# Patient Record
Sex: Female | Born: 1950
Health system: Southern US, Community
[De-identification: ages and names within clinical notes are randomized; demographics above are authoritative.]

## PROBLEM LIST (undated history)

## (undated) ENCOUNTER — Emergency Department (HOSPITAL_BASED_OUTPATIENT_CLINIC_OR_DEPARTMENT_OTHER): Admission: EM | Source: Home / Self Care

## (undated) DIAGNOSIS — K219 Gastro-esophageal reflux disease without esophagitis: Secondary | ICD-10-CM

## (undated) DIAGNOSIS — I499 Cardiac arrhythmia, unspecified: Secondary | ICD-10-CM

## (undated) DIAGNOSIS — I2510 Atherosclerotic heart disease of native coronary artery without angina pectoris: Secondary | ICD-10-CM

## (undated) DIAGNOSIS — E785 Hyperlipidemia, unspecified: Secondary | ICD-10-CM

## (undated) DIAGNOSIS — S52502A Unspecified fracture of the lower end of left radius, initial encounter for closed fracture: Secondary | ICD-10-CM

## (undated) DIAGNOSIS — I10 Essential (primary) hypertension: Secondary | ICD-10-CM

## (undated) DIAGNOSIS — E039 Hypothyroidism, unspecified: Secondary | ICD-10-CM

## (undated) HISTORY — DX: Hyperlipidemia, unspecified: E78.5

## (undated) HISTORY — DX: Atherosclerotic heart disease of native coronary artery without angina pectoris: I25.10

## (undated) HISTORY — PX: OTHER SURGICAL HISTORY: SHX169

## (undated) HISTORY — DX: Essential (primary) hypertension: I10

## (undated) HISTORY — DX: Hypothyroidism, unspecified: E03.9

---

## 2000-06-03 ENCOUNTER — Ambulatory Visit (HOSPITAL_COMMUNITY): Admission: RE | Admit: 2000-06-03 | Discharge: 2000-06-03 | Payer: Self-pay | Admitting: Obstetrics and Gynecology

## 2016-11-13 DIAGNOSIS — I10 Essential (primary) hypertension: Secondary | ICD-10-CM | POA: Diagnosis not present

## 2016-11-14 DIAGNOSIS — S90121A Contusion of right lesser toe(s) without damage to nail, initial encounter: Secondary | ICD-10-CM | POA: Diagnosis not present

## 2016-11-20 DIAGNOSIS — Z1159 Encounter for screening for other viral diseases: Secondary | ICD-10-CM | POA: Diagnosis not present

## 2016-11-20 DIAGNOSIS — E039 Hypothyroidism, unspecified: Secondary | ICD-10-CM | POA: Diagnosis not present

## 2016-11-20 DIAGNOSIS — Z23 Encounter for immunization: Secondary | ICD-10-CM | POA: Diagnosis not present

## 2016-11-20 DIAGNOSIS — Z0000 Encounter for general adult medical examination without abnormal findings: Secondary | ICD-10-CM | POA: Diagnosis not present

## 2016-11-20 DIAGNOSIS — Z780 Asymptomatic menopausal state: Secondary | ICD-10-CM | POA: Diagnosis not present

## 2016-11-20 DIAGNOSIS — Z1231 Encounter for screening mammogram for malignant neoplasm of breast: Secondary | ICD-10-CM | POA: Diagnosis not present

## 2017-01-22 DIAGNOSIS — E039 Hypothyroidism, unspecified: Secondary | ICD-10-CM | POA: Diagnosis not present

## 2017-01-22 DIAGNOSIS — Z1159 Encounter for screening for other viral diseases: Secondary | ICD-10-CM | POA: Diagnosis not present

## 2017-01-27 DIAGNOSIS — M2012 Hallux valgus (acquired), left foot: Secondary | ICD-10-CM | POA: Diagnosis not present

## 2017-01-27 DIAGNOSIS — M2011 Hallux valgus (acquired), right foot: Secondary | ICD-10-CM | POA: Diagnosis not present

## 2017-01-27 DIAGNOSIS — L600 Ingrowing nail: Secondary | ICD-10-CM | POA: Diagnosis not present

## 2017-04-05 DIAGNOSIS — I2510 Atherosclerotic heart disease of native coronary artery without angina pectoris: Secondary | ICD-10-CM | POA: Diagnosis not present

## 2017-04-05 DIAGNOSIS — E785 Hyperlipidemia, unspecified: Secondary | ICD-10-CM | POA: Diagnosis not present

## 2017-04-05 DIAGNOSIS — I10 Essential (primary) hypertension: Secondary | ICD-10-CM | POA: Diagnosis not present

## 2017-04-05 DIAGNOSIS — R0609 Other forms of dyspnea: Secondary | ICD-10-CM | POA: Diagnosis not present

## 2017-04-08 DIAGNOSIS — I10 Essential (primary) hypertension: Secondary | ICD-10-CM | POA: Diagnosis not present

## 2017-04-08 DIAGNOSIS — Z6828 Body mass index (BMI) 28.0-28.9, adult: Secondary | ICD-10-CM | POA: Diagnosis not present

## 2017-04-08 DIAGNOSIS — E038 Other specified hypothyroidism: Secondary | ICD-10-CM | POA: Diagnosis not present

## 2017-04-08 DIAGNOSIS — E663 Overweight: Secondary | ICD-10-CM | POA: Diagnosis not present

## 2017-04-08 DIAGNOSIS — Z1389 Encounter for screening for other disorder: Secondary | ICD-10-CM | POA: Diagnosis not present

## 2017-04-08 DIAGNOSIS — E784 Other hyperlipidemia: Secondary | ICD-10-CM | POA: Diagnosis not present

## 2017-05-05 DIAGNOSIS — I2510 Atherosclerotic heart disease of native coronary artery without angina pectoris: Secondary | ICD-10-CM | POA: Diagnosis not present

## 2017-05-05 DIAGNOSIS — R0609 Other forms of dyspnea: Secondary | ICD-10-CM | POA: Diagnosis not present

## 2017-05-05 DIAGNOSIS — I10 Essential (primary) hypertension: Secondary | ICD-10-CM | POA: Diagnosis not present

## 2017-05-14 DIAGNOSIS — E785 Hyperlipidemia, unspecified: Secondary | ICD-10-CM | POA: Diagnosis not present

## 2017-05-14 DIAGNOSIS — I10 Essential (primary) hypertension: Secondary | ICD-10-CM | POA: Diagnosis not present

## 2017-05-28 DIAGNOSIS — I10 Essential (primary) hypertension: Secondary | ICD-10-CM | POA: Diagnosis not present

## 2017-05-28 DIAGNOSIS — Z01419 Encounter for gynecological examination (general) (routine) without abnormal findings: Secondary | ICD-10-CM | POA: Diagnosis not present

## 2017-05-28 DIAGNOSIS — E039 Hypothyroidism, unspecified: Secondary | ICD-10-CM | POA: Diagnosis not present

## 2017-05-28 DIAGNOSIS — E785 Hyperlipidemia, unspecified: Secondary | ICD-10-CM | POA: Diagnosis not present

## 2017-05-28 DIAGNOSIS — Z780 Asymptomatic menopausal state: Secondary | ICD-10-CM | POA: Diagnosis not present

## 2017-06-07 DIAGNOSIS — I10 Essential (primary) hypertension: Secondary | ICD-10-CM | POA: Diagnosis not present

## 2017-06-07 DIAGNOSIS — E785 Hyperlipidemia, unspecified: Secondary | ICD-10-CM | POA: Diagnosis not present

## 2017-06-07 DIAGNOSIS — I2510 Atherosclerotic heart disease of native coronary artery without angina pectoris: Secondary | ICD-10-CM | POA: Diagnosis not present

## 2017-07-19 DIAGNOSIS — E785 Hyperlipidemia, unspecified: Secondary | ICD-10-CM | POA: Diagnosis not present

## 2017-07-19 NOTE — Progress Notes (Signed)
Referring-Richard Reynaldo Minium MD Reason for referral-CAD  HPI: 66 yo female for evaluation of CAD at request of Burnard Bunting MD. patient previously resided in [facility]. She presents to establish. She was noted to have coronary calcification on prior CT scan. Echocardiogram in April 2014 showed normal LV function. Last nuclear study July 2018 showed ejection fraction 73%. There was diaphragmatic attenuation but no ischemia. Patient denies dyspnea, chest pain, palpitations or syncope. She has not tolerated statins in the past because of myalgias.  Current Outpatient Prescriptions  Medication Sig Dispense Refill  . carvedilol (COREG) 3.125 MG tablet Take 3.125 mg by mouth 2 (two) times daily.  3  . ezetimibe (ZETIA) 10 MG tablet Take 10 mg by mouth daily.  3  . levothyroxine (SYNTHROID, LEVOTHROID) 100 MCG tablet Take 100 mcg by mouth daily. TAKE 1 TABLET EVERYDAY EXCEPT TAKE 1 AND 1 HALF TABLETS ON WEDNESDAYS.  2   No current facility-administered medications for this visit.     Allergies  Allergen Reactions  . Anaprox [Naproxen] Swelling    Lips swell.     Past Medical History:  Diagnosis Date  . CAD (coronary artery disease)   . Hyperlipidemia   . Hypertension   . Hypothyroid     Past Surgical History:  Procedure Laterality Date  . laproscopic endometrial surgrey      Social History   Social History  . Marital status: Married    Spouse name: N/A  . Number of children: 2  . Years of education: N/A   Occupational History  . Not on file.   Social History Main Topics  . Smoking status: Never Smoker  . Smokeless tobacco: Never Used  . Alcohol use No  . Drug use: Unknown  . Sexual activity: Not on file   Other Topics Concern  . Not on file   Social History Narrative  . No narrative on file    Family History  Problem Relation Age of Onset  . Heart attack Mother   . Heart disease Mother   . Stroke Mother   . Diabetes Father   . Heart failure  Father     ROS: no fevers or chills, productive cough, hemoptysis, dysphasia, odynophagia, melena, hematochezia, dysuria, hematuria, rash, seizure activity, orthopnea, PND, pedal edema, claudication. Remaining systems are negative.  Physical Exam:   Blood pressure (!) 142/100, pulse 68, height 5\' 1"  (1.549 m), weight 154 lb (69.9 kg).  General:  Well developed/well nourished in NAD Skin warm/dry Patient not depressed No peripheral clubbing Back-normal HEENT-normal/normal eyelids Neck supple/normal carotid upstroke bilaterally; no bruits; no JVD; no thyromegaly chest - CTA/ normal expansion CV - RRR/normal S1 and S2; no murmurs, rubs or gallops;  PMI nondisplaced Abdomen -NT/ND, no HSM, no mass, + bowel sounds, no bruit 2+ femoral pulses, no bruits Ext-no edema, chords, 2+ DP Neuro-grossly nonfocal  ECG - 04/05/2017-sinus rhythm with no ST changes. personally reviewed  A/P  1 CAD-based on previous CT scan showing coronary calcification. Patient is not having chest pain. Recent nuclear study by outside report negative with normal LV function. Continue medical therapy. Add enteric-coated aspirin 81 mg daily. She is intolerant to statins.  2 hypertension-blood pressure is elevated. Increase carvedilol to 6.25 mg twice a day as patient also has occasional mild palpitations. Add HCTZ 12.5 mg daily. Check potassium and renal function in one week. Follow blood pressure at home and advance regimen as needed.  3 hyperlipidemia-patient has not tolerated Lipitor, Zocor or Crestor in  the past because of myalgias. She brought her most recent laboratories with her today. These were personally reviewed. Her cholesterol in August 2018 was 273 with LDL 185. I will continue Zetia. I will refer to lipid clinic for consideration of Repatha.  Kirk Ruths, MD

## 2017-07-29 ENCOUNTER — Telehealth: Payer: Self-pay | Admitting: Cardiology

## 2017-07-29 NOTE — Telephone Encounter (Signed)
Received incoming records from [facility] for upcoming appointment on 08/02/17 @ 2pm with Dr. Stanford Breed. Records given to [facility] in Medical Records. 07/29/17 ab

## 2017-08-02 ENCOUNTER — Ambulatory Visit (INDEPENDENT_AMBULATORY_CARE_PROVIDER_SITE_OTHER): Payer: Medicare Other | Admitting: Cardiology

## 2017-08-02 ENCOUNTER — Encounter (INDEPENDENT_AMBULATORY_CARE_PROVIDER_SITE_OTHER): Payer: Self-pay

## 2017-08-02 ENCOUNTER — Encounter: Payer: Self-pay | Admitting: Cardiology

## 2017-08-02 VITALS — BP 142/100 | HR 68 | Ht 61.0 in | Wt 154.0 lb

## 2017-08-02 DIAGNOSIS — I10 Essential (primary) hypertension: Secondary | ICD-10-CM | POA: Diagnosis not present

## 2017-08-02 DIAGNOSIS — E7800 Pure hypercholesterolemia, unspecified: Secondary | ICD-10-CM

## 2017-08-02 DIAGNOSIS — I2510 Atherosclerotic heart disease of native coronary artery without angina pectoris: Secondary | ICD-10-CM | POA: Diagnosis not present

## 2017-08-02 MED ORDER — ASPIRIN EC 81 MG PO TBEC: 81.0000 mg | DELAYED_RELEASE_TABLET | Freq: Every day | ORAL | 3 refills | Status: DC

## 2017-08-02 MED ORDER — HYDROCHLOROTHIAZIDE 12.5 MG PO CAPS: 12.5000 mg | ORAL_CAPSULE | Freq: Every day | ORAL | 3 refills | Status: DC

## 2017-08-02 MED ORDER — CARVEDILOL 6.25 MG PO TABS: 6.2500 mg | ORAL_TABLET | Freq: Two times a day (BID) | ORAL | 3 refills | Status: DC

## 2017-08-02 NOTE — Patient Instructions (Signed)
Medication Instructions:   INCREASE CARVEDILOL TO 6.25 MG TWICE DAILY= 2 OF THE 3.125 MG TABLETS TWICE DAILY  START ASPIRIN 81 MG ONCE DAILY  START HCTZ 12.5 MG ONCE DAILY  Labwork:  Your physician recommends that you return for lab work in: [place]:  Your physician wants you to follow-up in: [place] will receive a reminder letter in the mail two months in advance. If you don't receive a letter, please call our office to schedule the follow-up appointment.   Your physician recommends that you schedule a follow-up appointment TO ESTABLISH WITH LIPID CLINIC TO DISCUSS MEDICATIONS  If you need a refill on your cardiac medications before your next appointment, please call your pharmacy.

## 2017-08-03 ENCOUNTER — Telehealth: Payer: Self-pay | Admitting: Cardiology

## 2017-08-03 NOTE — Telephone Encounter (Signed)
Called patient today as phones were not working on 08-02-17 to scheduled appointment with lipid clinic to discuss medications.  The patient states she has to call back when husband has his schedule to schedule it.

## 2017-08-05 ENCOUNTER — Telehealth: Payer: Self-pay | Admitting: Cardiology

## 2017-08-05 NOTE — Telephone Encounter (Signed)
Called the patient to schedule her appointment with the lipid clinic to discuss her medications.  Her mailbox was full and I could not leave a message.  The patient said she would call back when she left after her appointment.

## 2017-08-23 ENCOUNTER — Ambulatory Visit (INDEPENDENT_AMBULATORY_CARE_PROVIDER_SITE_OTHER): Payer: Medicare Other | Admitting: Pharmacist Clinician (PhC)/ Clinical Pharmacy Specialist

## 2017-08-23 DIAGNOSIS — I2510 Atherosclerotic heart disease of native coronary artery without angina pectoris: Secondary | ICD-10-CM | POA: Diagnosis not present

## 2017-08-23 DIAGNOSIS — E7801 Familial hypercholesterolemia: Secondary | ICD-10-CM | POA: Diagnosis not present

## 2017-08-23 DIAGNOSIS — E7849 Other hyperlipidemia: Secondary | ICD-10-CM

## 2017-08-23 MED ORDER — ROSUVASTATIN CALCIUM 5 MG PO TABS: ORAL_TABLET | ORAL | 3 refills | Status: DC

## 2017-08-23 NOTE — Progress Notes (Signed)
08/24/2017 Casey Kramer [birth year] [id number]   HPI:  Casey Kramer is a 66 y.o. female patient of Dr. Stanford Breed , who presents today for a lipid clinic evaluation.  She is new to our practice, having seen Dr. Stanford Breed for the first time last month.  She has only lived in [place] a short time, having moved here from [place] in July.  She had coronary calcium testing while in [place], and I am waiting for a copy of those results.  She also reports having developed myalgias while on statin drugs, prescribed by her PCP there.    Current Medications:  Ezetimibe 10 mg daily  Family history:  Mother with 2 MI and 3 strokes, plus multiple TIAs; first in her early 104's, now 63 (one of 45, 81 still living)  Father with AVreplacement, DM, PAD with stents (this year), now 6  Mother sister had multiple MI (3 puls stroke) died recently at 71,   1 son with DM, htn  1 daughter with high cholesterol, thyroid    Intolerant/previously tried:  Multiple statins, while living in [place]  Family history:   No tobacco, no alcohol;   Diet:   Now that moved eating out less; no restrictions on diet; veggies prefer fresh or canned;   Exercise:    No regular exercise since moved here, previously walked 2-3 miles per day  Labs:   05/2017:  TC 273, LDL 185 (pulled from Dr. Stanford Breed note)  Will have patient get labs this week   Current Outpatient Medications  Medication Sig Dispense Refill  . aspirin EC 81 MG tablet Take 1 tablet (81 mg total) by mouth daily. 90 tablet 3  . carvedilol (COREG) 6.25 MG tablet Take 1 tablet (6.25 mg total) by mouth 2 (two) times daily. 180 tablet 3  . ezetimibe (ZETIA) 10 MG tablet Take 10 mg by mouth daily.  3  . hydrochlorothiazide (MICROZIDE) 12.5 MG capsule Take 1 capsule (12.5 mg total) by mouth daily. 90 capsule 3  . levothyroxine (SYNTHROID, LEVOTHROID) 100 MCG tablet Take 100 mcg by mouth daily. TAKE 1 TABLET EVERYDAY EXCEPT TAKE 1 AND 1 HALF TABLETS ON  WEDNESDAYS.  2  . rosuvastatin (CRESTOR) 5 MG tablet Take 1 tablet by mouth up to 3 times per week as tolerated 12 tablet 3   No current facility-administered medications for this visit.     Allergies  Allergen Reactions  . Anaprox [Naproxen] Swelling    Lips swell.    Past Medical History:  Diagnosis Date  . CAD (coronary artery disease)   . Hyperlipidemia   . Hypertension   . Hypothyroid     There were no vitals taken for this visit.   Familial hyperlipidemia Patient with last reported LDL at 185 (LDL > 190 defined as primary hyperlipidemia).  Will need to get copies of her coronary calcium testing as well as updated lipid profile.  Once that information is in we can submit a request to her insurance company for approval of [place].      [name] Medal PharmD CPP [facility]

## 2017-08-23 NOTE — Patient Instructions (Signed)
Get labs drawn this week.  We will start the paperwork to get Repatha covered by your insurance.  The process can take anywhere from 1-4 weeks to get covered.    Evolocumab (Repatha) injection What is this medicine? EVOLOCUMAB (e voe LOK ue mab) is known as a PCSK9 inhibitor. It is used to lower the level of cholesterol in the blood. It may be used alone or in combination with other cholesterol-lowering drugs. This drug may also be used to reduce the risk of heart attack, stroke, and certain types of heart surgery in patients with heart disease. This medicine may be used for other purposes; ask your health care provider or pharmacist if you have questions. COMMON BRAND NAME(S): REPATHA What should I tell my health care provider before I take this medicine? They need to know if you have any of these conditions: -an unusual or allergic reaction to evolocumab, other medicines, foods, dyes, or preservatives -pregnant or trying to get pregnant -breast-feeding How should I use this medicine? This medicine is for injection under the skin. You will be taught how to prepare and give this medicine. Use exactly as directed. Take your medicine at regular intervals. Do not take your medicine more often than directed. It is important that you put your used needles and syringes in a special sharps container. Do not put them in a trash can. If you do not have a sharps container, call your pharmacist or health care provider to get one. Talk to your pediatrician regarding the use of this medicine in children. While this drug may be prescribed for children as young as 13 years for selected conditions, precautions do apply. Overdosage: If you think you have taken too much of this medicine contact a poison control center or emergency room at once. NOTE: This medicine is only for you. Do not share this medicine with others. What if I miss a dose? If you miss a dose, take it as soon as you can if there are more than 7  days until the next scheduled dose, or skip the missed dose and take the next dose according to your original schedule. Do not take double or extra doses. What may interact with this medicine? Interactions are not expected. This list may not describe all possible interactions. Give your health care provider a list of all the medicines, herbs, non-prescription drugs, or dietary supplements you use. Also tell them if you smoke, drink alcohol, or use illegal drugs. Some items may interact with your medicine. What should I watch for while using this medicine? You may need blood work while you are taking this medicine. What side effects may I notice from receiving this medicine? Side effects that you should report to your doctor or health care professional as soon as possible: -allergic reactions like skin rash, itching or hives, swelling of the face, lips, or tongue -signs and symptoms of infection like fever or chills; cough; sore throat; pain or trouble passing urine Side effects that usually do not require medical attention (report to your doctor or health care professional if they continue or are bothersome): -diarrhea -nausea -muscle pain -pain, redness, or irritation at site where injected This list may not describe all possible side effects. Call your doctor for medical advice about side effects. You may report side effects to FDA at 1-800-FDA-1088. Where should I keep my medicine? Keep out of the reach of children. You will be instructed on how to store this medicine. Throw away any unused medicine after  the expiration date on the label. NOTE: This sheet is a summary. It may not cover all possible information. If you have questions about this medicine, talk to your doctor, pharmacist, or health care provider.  2018 Elsevier/Gold Standard (2016-09-14 13:21:53)

## 2017-08-24 ENCOUNTER — Encounter: Payer: Self-pay | Admitting: Pharmacist Clinician (PhC)/ Clinical Pharmacy Specialist

## 2017-08-24 DIAGNOSIS — E7849 Other hyperlipidemia: Secondary | ICD-10-CM | POA: Diagnosis not present

## 2017-08-24 DIAGNOSIS — I2510 Atherosclerotic heart disease of native coronary artery without angina pectoris: Secondary | ICD-10-CM | POA: Diagnosis not present

## 2017-08-24 LAB — BASIC METABOLIC PANEL
BUN/Creatinine Ratio: 23 (ref 12–28)
BUN: 20 mg/dL (ref 8–27)
CALCIUM: 9.7 mg/dL (ref 8.7–10.3)
CHLORIDE: 102 mmol/L (ref 96–106)
CO2: 22 mmol/L (ref 20–29)
Creatinine, Ser: 0.86 mg/dL (ref 0.57–1.00)
GFR calc non Af Amer: 71 mL/min/{1.73_m2} (ref >59)
GFR, EST AFRICAN AMERICAN: 81 mL/min/{1.73_m2} (ref >59)
Glucose: 88 mg/dL (ref 65–99)
POTASSIUM: 4.4 mmol/L (ref 3.5–5.2)
SODIUM: 141 mmol/L (ref 134–144)

## 2017-08-24 NOTE — Assessment & Plan Note (Signed)
Patient with last reported LDL at 185 (LDL > 190 defined as primary hyperlipidemia).  Will need to get copies of her coronary calcium testing as well as updated lipid profile.  Once that information is in we can submit a request to her insurance company for approval of [place].

## 2017-08-26 ENCOUNTER — Encounter: Payer: Self-pay | Admitting: Cardiology

## 2017-08-26 NOTE — Telephone Encounter (Signed)
New message ° ° °Patient calling for lab results. Please call °

## 2017-08-26 NOTE — Telephone Encounter (Signed)
This encounter was created in error - please disregard.

## 2017-08-27 LAB — SPECIMEN STATUS REPORT

## 2017-08-27 LAB — LIPID PANEL
CHOL/HDL RATIO: 3.8 ratio (ref 0.0–4.4)
Cholesterol, Total: 234 mg/dL — ABNORMAL HIGH (ref 100–199)
HDL: 62 mg/dL (ref >39)
LDL CALC: 154 mg/dL — AB (ref 0–99)
TRIGLYCERIDES: 89 mg/dL (ref 0–149)
VLDL Cholesterol Cal: 18 mg/dL (ref 5–40)

## 2017-09-29 DIAGNOSIS — Z23 Encounter for immunization: Secondary | ICD-10-CM | POA: Diagnosis not present

## 2017-09-29 DIAGNOSIS — E663 Overweight: Secondary | ICD-10-CM | POA: Diagnosis not present

## 2017-09-29 DIAGNOSIS — E7849 Other hyperlipidemia: Secondary | ICD-10-CM | POA: Diagnosis not present

## 2017-09-29 DIAGNOSIS — E038 Other specified hypothyroidism: Secondary | ICD-10-CM | POA: Diagnosis not present

## 2017-09-29 DIAGNOSIS — I10 Essential (primary) hypertension: Secondary | ICD-10-CM | POA: Diagnosis not present

## 2017-11-22 DIAGNOSIS — E7801 Familial hypercholesterolemia: Secondary | ICD-10-CM | POA: Diagnosis not present

## 2017-11-22 LAB — LIPID PANEL
CHOL/HDL RATIO: 2.9 ratio (ref 0.0–4.4)
Cholesterol, Total: 184 mg/dL (ref 100–199)
HDL: 63 mg/dL (ref >39)
LDL Calculated: 103 mg/dL — ABNORMAL HIGH (ref 0–99)
Triglycerides: 92 mg/dL (ref 0–149)
VLDL CHOLESTEROL CAL: 18 mg/dL (ref 5–40)

## 2017-11-23 NOTE — Progress Notes (Signed)
Please schedule for CVRR clinic appt

## 2017-11-25 ENCOUNTER — Telehealth: Payer: Self-pay | Admitting: Pharmacist Clinician (PhC)/ Clinical Pharmacy Specialist

## 2017-11-30 NOTE — Telephone Encounter (Signed)
Spoke with patient to review labs - LDL now 103 on ezetimibe 10 mg daily and rosuvastatin 5 mg twice weekly.  Patient is primary prevention, LDL at goal now.

## 2017-12-22 ENCOUNTER — Other Ambulatory Visit: Payer: Self-pay | Admitting: Cardiology

## 2017-12-23 ENCOUNTER — Ambulatory Visit (INDEPENDENT_AMBULATORY_CARE_PROVIDER_SITE_OTHER): Payer: Medicare Other | Admitting: Podiatry

## 2017-12-23 ENCOUNTER — Encounter: Payer: Self-pay | Admitting: Podiatry

## 2017-12-23 VITALS — BP 167/95 | HR 66

## 2017-12-23 DIAGNOSIS — L600 Ingrowing nail: Secondary | ICD-10-CM | POA: Diagnosis not present

## 2017-12-23 DIAGNOSIS — B351 Tinea unguium: Secondary | ICD-10-CM | POA: Diagnosis not present

## 2017-12-23 NOTE — Patient Instructions (Signed)
You can get fungi-nail at the drug store  [place] 1/4 cup of epsom salts in a quart of warm tap water.  Submerge your foot or feet with outer bandage intact for the initial soak; this will allow the bandage to become moist and wet for easy lift off.  Once you remove your bandage, continue to soak in the solution for 20 minutes.  This soak should be done twice a day.  Next, remove your foot or feet from solution, blot dry the affected area and cover.  You may use a band aid large enough to cover the area or use gauze and tape.  Apply other medications to the area as directed by the doctor such as polysporin neosporin.  IF YOUR SKIN BECOMES IRRITATED WHILE USING THESE INSTRUCTIONS, IT IS OKAY TO SWITCH TO  WHITE VINEGAR AND WATER. Or you may use antibacterial soap and water to keep the toe clean  Monitor for any signs/symptoms of infection. Call the office immediately if any occur or go directly to the emergency room. Call with any questions/concerns.

## 2017-12-23 NOTE — Telephone Encounter (Signed)
Rx(s) sent to pharmacy electronically.  

## 2017-12-27 NOTE — Progress Notes (Signed)
Subjective:   Patient ID: Casey Kramer, female   DOB: 67 y.o.   MRN: [id number]   HPI Casey Kramer presents the office today for concerns of the left big toenail becoming pink on the nail corner.  She states this is been ongoing for about 1 year.  She previously saw a podiatrist in [place].  She states that there was no treatment at that point and she states is gotten somewhat worse.  Her nails are somewhat thickened discolored.  She denies any drainage or pus coming from the area.  She has no other concerns today.   Review of Systems  All other systems reviewed and are negative.  Past Medical History:  Diagnosis Date  . CAD (coronary artery disease)   . Hyperlipidemia   . Hypertension   . Hypothyroid     Past Surgical History:  Procedure Laterality Date  . laproscopic endometrial surgrey       Current Outpatient Medications:  .  aspirin EC 81 MG tablet, Take 1 tablet (81 mg total) by mouth daily., Disp: 90 tablet, Rfl: 3 .  carvedilol (COREG) 6.25 MG tablet, Take 1 tablet (6.25 mg total) by mouth 2 (two) times daily., Disp: 180 tablet, Rfl: 3 .  ezetimibe (ZETIA) 10 MG tablet, Take 10 mg by mouth daily., Disp: , Rfl: 3 .  levothyroxine (SYNTHROID, LEVOTHROID) 100 MCG tablet, Take 100 mcg by mouth daily. TAKE 1 TABLET EVERYDAY EXCEPT TAKE 1 AND 1 HALF TABLETS ON WEDNESDAYS., Disp: , Rfl: 2 .  rosuvastatin (CRESTOR) 5 MG tablet, TAKE 1 TABLET BY MOUTH UP TO 3 TIMES PER WEEK AS TOLERATED, Disp: 12 tablet, Rfl: 6 .  Calcium-Magnesium-Vitamin D (CALCIUM 500 PO), Takes 3 times daily, Disp: , Rfl:  .  hydrochlorothiazide (MICROZIDE) 12.5 MG capsule, Take 1 capsule (12.5 mg total) by mouth daily., Disp: 90 capsule, Rfl: 3 .  UNABLE TO FIND, aspirin 81 mg effervescent tablet, Disp: , Rfl:  .  UNABLE TO FIND, Vitamin D3 1,000 unit capsule, Disp: , Rfl:   Allergies  Allergen Reactions  . Statins Other (See Comments)  . Amlodipine   . Aspirin   . Naproxen Swelling and Other (See Comments)     Lips swell.  . Other Other (See Comments)    Social History   Socioeconomic History  . Marital status: Married    Spouse name: Not on file  . Number of children: 2  . Years of education: Not on file  . Highest education level: Not on file  Social Needs  . Financial resource strain: Not on file  . Food insecurity - worry: Not on file  . Food insecurity - inability: Not on file  . Transportation needs - medical: Not on file  . Transportation needs - non-medical: Not on file  Occupational History  . Not on file  Tobacco Use  . Smoking status: Never Smoker  . Smokeless tobacco: Never Used  Substance and Sexual Activity  . Alcohol use: No  . Drug use: Not on file  . Sexual activity: Not on file  Other Topics Concern  . Not on file  Social History Narrative  . Not on file         Objective:  Physical Exam  General: AAO x3, NAD  Dermatological: There is incurvation present along the left hallux nail corner there is mild pink discoloration to the nail corner there is tenderness over the nail distally.  There is no drainage or pus there is no ascending cellulitis.  There is no fluctuation or crepitation.  There is no malodor.  No open lesions or pre-ulcerative lesions.  The nail itself is somewhat discolored yellow-brown discoloration and mildly thickened.  Vascular: Dorsalis Pedis artery and Posterior Tibial artery pedal pulses are 2/4 bilateral with immedate capillary fill time.  There is no pain with calf compression, swelling, warmth, erythema.   Neruologic: Grossly intact via light touch bilateral.Protective threshold with Semmes Wienstein monofilament intact to all pedal sites bilateral.   Musculoskeletal: No gross boney pedal deformities bilateral. No pain, crepitus, or limitation noted with foot and ankle range of motion bilateral. Muscular strength 5/5 in all groups tested bilateral.  Gait: Unassisted, Nonantalgic.       Assessment:   Ingrown toenail left  hallux toenail with likely onychomycosis    Plan:  -Treatment options discussed including all alternatives, risks, and complications -Etiology of symptoms were discussed -Discussed partial nail avulsion.  I did sharply debride the toenail so that any complications or bleeding.  If symptoms continue she did have a partial nail avulsion.  Given the thickening discoloration discussed nail fungus treatment.  She is instructed Fungi-Nail OTC.  Trula Slade DPM

## 2018-01-13 DIAGNOSIS — Z6829 Body mass index (BMI) 29.0-29.9, adult: Secondary | ICD-10-CM | POA: Diagnosis not present

## 2018-01-13 DIAGNOSIS — E7849 Other hyperlipidemia: Secondary | ICD-10-CM | POA: Diagnosis not present

## 2018-01-13 DIAGNOSIS — M79605 Pain in left leg: Secondary | ICD-10-CM | POA: Diagnosis not present

## 2018-01-13 DIAGNOSIS — M791 Myalgia, unspecified site: Secondary | ICD-10-CM | POA: Diagnosis not present

## 2018-01-13 DIAGNOSIS — E559 Vitamin D deficiency, unspecified: Secondary | ICD-10-CM | POA: Diagnosis not present

## 2018-01-13 DIAGNOSIS — Z1321 Encounter for screening for nutritional disorder: Secondary | ICD-10-CM | POA: Diagnosis not present

## 2018-01-13 DIAGNOSIS — I10 Essential (primary) hypertension: Secondary | ICD-10-CM | POA: Diagnosis not present

## 2018-01-13 DIAGNOSIS — M545 Low back pain: Secondary | ICD-10-CM | POA: Diagnosis not present

## 2018-01-14 ENCOUNTER — Ambulatory Visit: Payer: Medicare Other | Admitting: Podiatry

## 2018-02-17 DIAGNOSIS — Z6829 Body mass index (BMI) 29.0-29.9, adult: Secondary | ICD-10-CM | POA: Diagnosis not present

## 2018-02-17 DIAGNOSIS — J069 Acute upper respiratory infection, unspecified: Secondary | ICD-10-CM | POA: Diagnosis not present

## 2018-02-17 DIAGNOSIS — I10 Essential (primary) hypertension: Secondary | ICD-10-CM | POA: Diagnosis not present

## 2018-02-17 DIAGNOSIS — R05 Cough: Secondary | ICD-10-CM | POA: Diagnosis not present

## 2018-02-17 DIAGNOSIS — R52 Pain, unspecified: Secondary | ICD-10-CM | POA: Diagnosis not present

## 2018-03-04 ENCOUNTER — Ambulatory Visit: Payer: Medicare Other | Admitting: Cardiology

## 2018-03-29 DIAGNOSIS — R82998 Other abnormal findings in urine: Secondary | ICD-10-CM | POA: Diagnosis not present

## 2018-03-29 DIAGNOSIS — I10 Essential (primary) hypertension: Secondary | ICD-10-CM | POA: Diagnosis not present

## 2018-03-29 DIAGNOSIS — E038 Other specified hypothyroidism: Secondary | ICD-10-CM | POA: Diagnosis not present

## 2018-03-31 DIAGNOSIS — Z1212 Encounter for screening for malignant neoplasm of rectum: Secondary | ICD-10-CM | POA: Diagnosis not present

## 2018-04-04 NOTE — Progress Notes (Signed)
HPI: FU CAD. She was noted to have coronary calcification on prior CT scan. Echocardiogram in April 2014 showed normal LV function. Last nuclear study July 2018 showed ejection fraction 73%. There was diaphragmatic attenuation but no ischemia. Since last seen, the patient denies any dyspnea on exertion, orthopnea, PND, pedal edema, palpitations, syncope or chest pain.   Current Outpatient Medications  Medication Sig Dispense Refill  . aspirin EC 81 MG tablet Take 1 tablet (81 mg total) by mouth daily. 90 tablet 3  . Calcium-Magnesium-Vitamin D (CALCIUM 500 PO) Takes 3 times daily    . carvedilol (COREG) 6.25 MG tablet Take 1 tablet (6.25 mg total) by mouth 2 (two) times daily. 180 tablet 3  . ezetimibe (ZETIA) 10 MG tablet Take 10 mg by mouth daily.  3  . levothyroxine (SYNTHROID, LEVOTHROID) 100 MCG tablet Take 100 mcg by mouth daily. TAKE 1 TABLET EVERYDAY EXCEPT TAKE 1 AND 1 HALF TABLETS ON WEDNESDAYS.  2  . rosuvastatin (CRESTOR) 5 MG tablet TAKE 1 TABLET BY MOUTH UP TO 3 TIMES PER WEEK AS TOLERATED 12 tablet 6  . UNABLE TO FIND aspirin 81 mg effervescent tablet    . UNABLE TO FIND Vitamin D3 1,000 unit capsule    . hydrochlorothiazide (MICROZIDE) 12.5 MG capsule Take 1 capsule (12.5 mg total) by mouth daily. 90 capsule 3   No current facility-administered medications for this visit.      Past Medical History:  Diagnosis Date  . CAD (coronary artery disease)   . Hyperlipidemia   . Hypertension   . Hypothyroid     Past Surgical History:  Procedure Laterality Date  . laproscopic endometrial surgrey      Social History   Socioeconomic History  . Marital status: Married    Spouse name: Not on file  . Number of children: 2  . Years of education: Not on file  . Highest education level: Not on file  Occupational History  . Not on file  Social Needs  . Financial resource strain: Not on file  . Food insecurity:    Worry: Not on file    Inability: Not on file  .  Transportation needs:    Medical: Not on file    Non-medical: Not on file  Tobacco Use  . Smoking status: Never Smoker  . Smokeless tobacco: Never Used  Substance and Sexual Activity  . Alcohol use: No  . Drug use: Not on file  . Sexual activity: Not on file  Lifestyle  . Physical activity:    Days per week: Not on file    Minutes per session: Not on file  . Stress: Not on file  Relationships  . Social connections:    Talks on phone: Not on file    Gets together: Not on file    Attends religious service: Not on file    Active member of club or organization: Not on file    Attends meetings of clubs or organizations: Not on file    Relationship status: Not on file  . Intimate partner violence:    Fear of current or ex partner: Not on file    Emotionally abused: Not on file    Physically abused: Not on file    Forced sexual activity: Not on file  Other Topics Concern  . Not on file  Social History Narrative  . Not on file    Family History  Problem Relation Age of Onset  . Heart attack Mother   .  Heart disease Mother   . Stroke Mother   . Diabetes Father   . Heart failure Father     ROS: no fevers or chills, productive cough, hemoptysis, dysphasia, odynophagia, melena, hematochezia, dysuria, hematuria, rash, seizure activity, orthopnea, PND, pedal edema, claudication. Remaining systems are negative.  Physical Exam: Well-developed well-nourished in no acute distress.  Skin is warm and dry.  HEENT is normal.  Neck is supple.  Chest is clear to auscultation with normal expansion.  Cardiovascular exam is regular rate and rhythm.  Abdominal exam nontender or distended. No masses palpated. Extremities show no edema. neuro grossly intact  ECG- personally reviewed  A/P  1 coronary artery disease-this is based on calcium noted in coronaries on previous CT scan.  Patient is not having chest pain and previous nuclear study showed no ischemia.  Plan medical therapy.   Continue aspirin.  He is intolerant to statins.  2 Hypertension-blood pressure is mildly elevated.  However she states typically controlled at home.  Continue to follow and advance regimen as needed.  3 hyperlipidemia-patient has been intolerant to high-dose statins previously.  We will continue with Zetia 10 mg daily and Crestor 5 mg twice weekly.  She is followed in lipid clinic.  She was turned down for Repatha.  Kirk Ruths, MD

## 2018-04-06 DIAGNOSIS — Z6828 Body mass index (BMI) 28.0-28.9, adult: Secondary | ICD-10-CM | POA: Diagnosis not present

## 2018-04-06 DIAGNOSIS — E663 Overweight: Secondary | ICD-10-CM | POA: Diagnosis not present

## 2018-04-06 DIAGNOSIS — E7849 Other hyperlipidemia: Secondary | ICD-10-CM | POA: Diagnosis not present

## 2018-04-06 DIAGNOSIS — M791 Myalgia, unspecified site: Secondary | ICD-10-CM | POA: Diagnosis not present

## 2018-04-06 DIAGNOSIS — Z1389 Encounter for screening for other disorder: Secondary | ICD-10-CM | POA: Diagnosis not present

## 2018-04-06 DIAGNOSIS — E038 Other specified hypothyroidism: Secondary | ICD-10-CM | POA: Diagnosis not present

## 2018-04-06 DIAGNOSIS — Z0000 Encounter for general adult medical examination without abnormal findings: Secondary | ICD-10-CM | POA: Diagnosis not present

## 2018-04-06 DIAGNOSIS — Z23 Encounter for immunization: Secondary | ICD-10-CM | POA: Diagnosis not present

## 2018-04-06 DIAGNOSIS — I10 Essential (primary) hypertension: Secondary | ICD-10-CM | POA: Diagnosis not present

## 2018-04-15 ENCOUNTER — Ambulatory Visit (INDEPENDENT_AMBULATORY_CARE_PROVIDER_SITE_OTHER): Payer: Medicare Other | Admitting: Cardiology

## 2018-04-15 ENCOUNTER — Encounter: Payer: Self-pay | Admitting: Cardiology

## 2018-04-15 VITALS — BP 138/80 | HR 60 | Temp 80.0°F | Ht 61.0 in | Wt 153.0 lb

## 2018-04-15 DIAGNOSIS — I10 Essential (primary) hypertension: Secondary | ICD-10-CM

## 2018-04-15 DIAGNOSIS — I2510 Atherosclerotic heart disease of native coronary artery without angina pectoris: Secondary | ICD-10-CM

## 2018-04-15 DIAGNOSIS — E7800 Pure hypercholesterolemia, unspecified: Secondary | ICD-10-CM

## 2018-04-15 NOTE — Patient Instructions (Signed)
Your physician wants you to follow-up in: ONE YEAR WITH DR CRENSHAW You will receive a reminder letter in the mail two months in advance. If you don't receive a letter, please call our office to schedule the follow-up appointment.   If you need a refill on your cardiac medications before your next appointment, please call your pharmacy.  

## 2018-06-30 ENCOUNTER — Other Ambulatory Visit: Payer: Self-pay | Admitting: Internal Medicine

## 2018-06-30 DIAGNOSIS — Z1231 Encounter for screening mammogram for malignant neoplasm of breast: Secondary | ICD-10-CM

## 2018-07-31 ENCOUNTER — Other Ambulatory Visit: Payer: Self-pay | Admitting: Cardiology

## 2018-08-03 ENCOUNTER — Encounter: Payer: Self-pay | Admitting: Radiology

## 2018-08-03 ENCOUNTER — Ambulatory Visit
Admission: RE | Admit: 2018-08-03 | Discharge: 2018-08-03 | Disposition: A | Discharge status: Home / Self Care | Payer: Medicare Other | Source: Ambulatory Visit | Attending: Internal Medicine | Admitting: Internal Medicine

## 2018-08-03 DIAGNOSIS — Z1231 Encounter for screening mammogram for malignant neoplasm of breast: Secondary | ICD-10-CM

## 2018-08-25 DIAGNOSIS — Z23 Encounter for immunization: Secondary | ICD-10-CM | POA: Diagnosis not present

## 2018-10-21 DIAGNOSIS — Z1331 Encounter for screening for depression: Secondary | ICD-10-CM | POA: Diagnosis not present

## 2018-10-21 DIAGNOSIS — Z1339 Encounter for screening examination for other mental health and behavioral disorders: Secondary | ICD-10-CM | POA: Diagnosis not present

## 2018-10-21 DIAGNOSIS — Z1389 Encounter for screening for other disorder: Secondary | ICD-10-CM | POA: Diagnosis not present

## 2018-10-21 DIAGNOSIS — M791 Myalgia, unspecified site: Secondary | ICD-10-CM | POA: Diagnosis not present

## 2018-10-21 DIAGNOSIS — I10 Essential (primary) hypertension: Secondary | ICD-10-CM | POA: Diagnosis not present

## 2018-10-21 DIAGNOSIS — E663 Overweight: Secondary | ICD-10-CM | POA: Diagnosis not present

## 2018-10-21 DIAGNOSIS — E038 Other specified hypothyroidism: Secondary | ICD-10-CM | POA: Diagnosis not present

## 2018-10-21 DIAGNOSIS — Z6828 Body mass index (BMI) 28.0-28.9, adult: Secondary | ICD-10-CM | POA: Diagnosis not present

## 2018-10-21 DIAGNOSIS — E7849 Other hyperlipidemia: Secondary | ICD-10-CM | POA: Diagnosis not present

## 2019-04-05 ENCOUNTER — Emergency Department (HOSPITAL_COMMUNITY)
Admission: EM | Admit: 2019-04-05 | Discharge: 2019-04-05 | Disposition: A | Discharge status: Home / Self Care | Payer: Medicare Other | Attending: Emergency Medicine | Admitting: Emergency Medicine

## 2019-04-05 ENCOUNTER — Other Ambulatory Visit: Payer: Self-pay

## 2019-04-05 ENCOUNTER — Encounter (HOSPITAL_COMMUNITY): Payer: Self-pay | Admitting: Emergency Medicine

## 2019-04-05 ENCOUNTER — Emergency Department (HOSPITAL_COMMUNITY): Payer: Medicare Other

## 2019-04-05 ENCOUNTER — Telehealth: Payer: Self-pay | Admitting: Cardiology

## 2019-04-05 DIAGNOSIS — R002 Palpitations: Secondary | ICD-10-CM

## 2019-04-05 DIAGNOSIS — Z7982 Long term (current) use of aspirin: Secondary | ICD-10-CM | POA: Insufficient documentation

## 2019-04-05 DIAGNOSIS — I10 Essential (primary) hypertension: Secondary | ICD-10-CM | POA: Insufficient documentation

## 2019-04-05 DIAGNOSIS — I259 Chronic ischemic heart disease, unspecified: Secondary | ICD-10-CM | POA: Diagnosis not present

## 2019-04-05 DIAGNOSIS — Z79899 Other long term (current) drug therapy: Secondary | ICD-10-CM | POA: Diagnosis not present

## 2019-04-05 DIAGNOSIS — Z0389 Encounter for observation for other suspected diseases and conditions ruled out: Secondary | ICD-10-CM | POA: Diagnosis not present

## 2019-04-05 DIAGNOSIS — E039 Hypothyroidism, unspecified: Secondary | ICD-10-CM | POA: Insufficient documentation

## 2019-04-05 LAB — COMPREHENSIVE METABOLIC PANEL
ALT: 16 U/L (ref 0–44)
AST: 18 U/L (ref 15–41)
Albumin: 4 g/dL (ref 3.5–5.0)
Alkaline Phosphatase: 76 U/L (ref 38–126)
Anion gap: 11 (ref 5–15)
BUN: 20 mg/dL (ref 8–23)
CO2: 26 mmol/L (ref 22–32)
Calcium: 9.5 mg/dL (ref 8.9–10.3)
Chloride: 101 mmol/L (ref 98–111)
Creatinine, Ser: 0.87 mg/dL (ref 0.44–1.00)
GFR calc Af Amer: >60 mL/min (ref >60)
GFR calc non Af Amer: >60 mL/min (ref >60)
Glucose, Bld: 107 mg/dL — ABNORMAL HIGH (ref 70–99)
Potassium: 3.3 mmol/L — ABNORMAL LOW (ref 3.5–5.1)
Sodium: 138 mmol/L (ref 135–145)
Total Bilirubin: 0.6 mg/dL (ref 0.3–1.2)
Total Protein: 7.3 g/dL (ref 6.5–8.1)

## 2019-04-05 LAB — CBC WITH DIFFERENTIAL/PLATELET
Abs Immature Granulocytes: 0.01 10*3/uL (ref 0.00–0.07)
Basophils Absolute: 0.1 10*3/uL (ref 0.0–0.1)
Basophils Relative: 1 %
Eosinophils Absolute: 0.3 10*3/uL (ref 0.0–0.5)
Eosinophils Relative: 3 %
HCT: 38.9 % (ref 36.0–46.0)
Hemoglobin: 13 g/dL (ref 12.0–15.0)
Immature Granulocytes: 0 %
Lymphocytes Relative: 35 %
Lymphs Abs: 2.6 10*3/uL (ref 0.7–4.0)
MCH: 28.8 pg (ref 26.0–34.0)
MCHC: 33.4 g/dL (ref 30.0–36.0)
MCV: 86.1 fL (ref 80.0–100.0)
Monocytes Absolute: 0.7 10*3/uL (ref 0.1–1.0)
Monocytes Relative: 9 %
Neutro Abs: 3.9 10*3/uL (ref 1.7–7.7)
Neutrophils Relative %: 52 %
Platelets: 231 10*3/uL (ref 150–400)
RBC: 4.52 MIL/uL (ref 3.87–5.11)
RDW: 12.9 % (ref 11.5–15.5)
WBC: 7.5 10*3/uL (ref 4.0–10.5)
nRBC: 0 % (ref 0.0–0.2)

## 2019-04-05 LAB — TROPONIN I (HIGH SENSITIVITY)
Troponin I (High Sensitivity): 2 ng/L (ref <18)
Troponin I (High Sensitivity): 4 ng/L (ref <18)

## 2019-04-05 LAB — MAGNESIUM: Magnesium: 2 mg/dL (ref 1.7–2.4)

## 2019-04-05 LAB — TSH: TSH: 0.834 u[IU]/mL (ref 0.350–4.500)

## 2019-04-05 MED ORDER — SODIUM CHLORIDE 0.9 % IV BOLUS
1000.0000 mL | Freq: Once | INTRAVENOUS | Status: AC
  Administered 2019-04-05: 1000 mL via INTRAVENOUS

## 2019-04-05 NOTE — ED Triage Notes (Signed)
Pt states she woke up with her heart beating fast along with shortness of breath.

## 2019-04-05 NOTE — Telephone Encounter (Signed)
  Casey Kramer wants to make Dr Stanford Breed aware that she had to go to the ER last night at [facility]. She would like for him to look over he visit summary and let her know if she needs to be seen sooner than August. She had palpitations that woke her up out of her sleep and she felt like her heart was going to pound out of her chest. She still feels the palpitations today but feels better.

## 2019-04-05 NOTE — Telephone Encounter (Signed)
Will forward to Dr Stanford Breed for review .Adonis Housekeeper

## 2019-04-05 NOTE — Telephone Encounter (Signed)
Spoke with pt, Aware of dr crenshaw's recommendations.  °

## 2019-04-05 NOTE — ED Provider Notes (Signed)
[facility] EMERGENCY DEPARTMENT Provider Note   CSN: [id number] Arrival date & time: 04/05/19  0132    History   Chief Complaint Chief Complaint  Patient presents with  . Palpitations    HPI Casey Kramer is a 68 y.o. female.     Pt presents to the ED today with palpitations.  Pt said they woke her up from sleep.  She feels better now.   The pt said she's had palpitations periodically since covid started, but this was the worst episode.  She has not had f/c.  No sob.  No cp.  No known covid exposures.     Past Medical History:  Diagnosis Date  . CAD (coronary artery disease)   . Hyperlipidemia   . Hypertension   . Hypothyroid     Patient Active Problem List   Diagnosis Date Noted  . Familial hyperlipidemia 08/24/2017    Past Surgical History:  Procedure Laterality Date  . laproscopic endometrial surgrey       OB History   No obstetric history on file.      Home Medications    Prior to Admission medications   Medication Sig Start Date End Date Taking? Authorizing Provider  aspirin EC 81 MG tablet Take 1 tablet (81 mg total) by mouth daily. 08/02/17  Yes Lelon Perla, MD  Calcium-Magnesium-Vitamin D (CALCIUM 500 PO) Takes 3 times daily   Yes [provider]  carvedilol (COREG) 6.25 MG tablet TAKE 1 TABLET BY MOUTH TWICE A DAY 08/02/18  Yes Lelon Perla, MD  ezetimibe (ZETIA) 10 MG tablet Take 10 mg by mouth daily. 07/04/17  Yes [provider]  hydrochlorothiazide (MICROZIDE) 12.5 MG capsule TAKE 1 CAPSULE BY MOUTH EVERY DAY 08/02/18  Yes Crenshaw, Denice Bors, MD  levothyroxine (SYNTHROID, LEVOTHROID) 100 MCG tablet Take 100 mcg by mouth daily. TAKE 1 TABLET EVERYDAY EXCEPT TAKE 1 AND 1 HALF TABLETS ON WEDNESDAYS. 07/07/17  Yes [provider]  rosuvastatin (CRESTOR) 5 MG tablet TAKE 1 TABLET BY MOUTH UP TO 3 TIMES PER WEEK AS TOLERATED 12/23/17  Yes Crenshaw, Denice Bors, MD  UNABLE TO FIND aspirin 81 mg effervescent tablet    Yes [provider]  UNABLE TO FIND Vitamin D3 1,000 unit capsule   Yes [provider]    Family History Family History  Problem Relation Age of Onset  . Heart attack Mother   . Heart disease Mother   . Stroke Mother   . Diabetes Father   . Heart failure Father   . Breast cancer Neg Hx     Social History Social History   Tobacco Use  . Smoking status: Never Smoker  . Smokeless tobacco: Never Used  Substance Use Topics  . Alcohol use: No  . Drug use: Not on file     Allergies   Statins, Amlodipine, Aspirin, Naproxen, and Other   Review of Systems Review of Systems  Cardiovascular: Positive for palpitations.  All other systems reviewed and are negative.    Physical Exam Updated Vital Signs BP (!) 157/75   Pulse 67   Temp 98.1 F (36.7 C) (Oral)   Resp 15   Ht 5\' 1"  (1.549 m)   Wt 70.8 kg   SpO2 96%   BMI 29.48 kg/m   Physical Exam Vitals signs and nursing note reviewed.  Constitutional:      Appearance: Normal appearance.  HENT:     Head: Normocephalic and atraumatic.     Right Ear: External  ear normal.     Left Ear: External ear normal.     Nose: Nose normal.     Mouth/Throat:     Mouth: Mucous membranes are moist.     Pharynx: Oropharynx is clear.  Eyes:     Extraocular Movements: Extraocular movements intact.     Conjunctiva/sclera: Conjunctivae normal.     Pupils: Pupils are equal, round, and reactive to light.  Neck:     Musculoskeletal: Normal range of motion and neck supple.  Cardiovascular:     Rate and Rhythm: Normal rate and regular rhythm.     Pulses: Normal pulses.     Heart sounds: Normal heart sounds.  Pulmonary:     Effort: Pulmonary effort is normal.     Breath sounds: Normal breath sounds.  Abdominal:     General: Abdomen is flat. Bowel sounds are normal.     Palpations: Abdomen is soft.  Musculoskeletal: Normal range of motion.  Skin:    General: Skin is warm.     Capillary Refill: Capillary refill  takes less than 2 seconds.  Neurological:     General: No focal deficit present.     Mental Status: She is alert and oriented to person, place, and time.  Psychiatric:        Mood and Affect: Mood normal.        Behavior: Behavior normal.      ED Treatments / Results  Labs (all labs ordered are listed, but only abnormal results are displayed) Labs Reviewed  COMPREHENSIVE METABOLIC PANEL - Abnormal; Notable for the following components:      Result Value   Potassium 3.3 (*)    Glucose, Bld 107 (*)    All other components within normal limits  CBC WITH DIFFERENTIAL/PLATELET  TROPONIN I (HIGH SENSITIVITY)  TROPONIN I (HIGH SENSITIVITY)  TSH  MAGNESIUM  URINALYSIS, ROUTINE W REFLEX MICROSCOPIC    EKG EKG Interpretation  Date/Time:  Wednesday April 05 2019 01:47:06 EDT Ventricular Rate:  73 PR Interval:    QRS Duration: 92 QT Interval:  396 QTC Calculation: 437 R Axis:   13 Text Interpretation:  Sinus rhythm Confirmed by Isla Pence [phone]) on 04/05/2019 3:02:39 AM   Radiology Dg Chest Portable 1 View  Result Date: 04/05/2019 CLINICAL DATA:  Pelvis patient's EXAM: PORTABLE CHEST 1 VIEW COMPARISON:  None. FINDINGS: The heart size and mediastinal contours are within normal limits. Both lungs are clear. The visualized skeletal structures are unremarkable. IMPRESSION: No active disease. Electronically Signed   By: Constance Holster M.D.   On: 04/05/2019 03:06    Procedures Procedures (including critical care time)  Medications Ordered in ED Medications  sodium chloride 0.9 % bolus 1,000 mL (0 mLs Intravenous Stopped 04/05/19 0419)     Initial Impression / Assessment and Plan / ED Course  I have reviewed the triage vital signs and the nursing notes.  Pertinent labs & imaging results that were available during my care of the patient were reviewed by me and considered in my medical decision making (see chart for details).        Pt observed for several hrs in ED.   NSR the entire time.  Labs wnl.  Pt has an appt with her cardiologist, Dr. Stanford Breed in a few weeks.  She is told to return if worse.  F/u with pcp.  Final Clinical Impressions(s) / ED Diagnoses   Final diagnoses:  Palpitations    ED Discharge Orders    None  Isla Pence, MD 04/05/19 [phone]

## 2019-04-05 NOTE — Telephone Encounter (Signed)
No arrhythmia noted in ER; FU as scheduled .Kirk Ruths

## 2019-04-05 NOTE — ED Notes (Signed)
Pt had went to the restroom before was told to get urine sample. Pt is aware now that we need urine sample.

## 2019-04-12 DIAGNOSIS — E038 Other specified hypothyroidism: Secondary | ICD-10-CM | POA: Diagnosis not present

## 2019-04-12 DIAGNOSIS — I10 Essential (primary) hypertension: Secondary | ICD-10-CM | POA: Diagnosis not present

## 2019-04-17 DIAGNOSIS — R82998 Other abnormal findings in urine: Secondary | ICD-10-CM | POA: Diagnosis not present

## 2019-04-17 DIAGNOSIS — I10 Essential (primary) hypertension: Secondary | ICD-10-CM | POA: Diagnosis not present

## 2019-04-19 DIAGNOSIS — E663 Overweight: Secondary | ICD-10-CM | POA: Diagnosis not present

## 2019-04-19 DIAGNOSIS — M791 Myalgia, unspecified site: Secondary | ICD-10-CM | POA: Diagnosis not present

## 2019-04-19 DIAGNOSIS — Z0000 Encounter for general adult medical examination without abnormal findings: Secondary | ICD-10-CM | POA: Diagnosis not present

## 2019-04-19 DIAGNOSIS — E785 Hyperlipidemia, unspecified: Secondary | ICD-10-CM | POA: Diagnosis not present

## 2019-04-19 DIAGNOSIS — E039 Hypothyroidism, unspecified: Secondary | ICD-10-CM | POA: Diagnosis not present

## 2019-04-19 DIAGNOSIS — R002 Palpitations: Secondary | ICD-10-CM | POA: Diagnosis not present

## 2019-04-19 DIAGNOSIS — I10 Essential (primary) hypertension: Secondary | ICD-10-CM | POA: Diagnosis not present

## 2019-05-12 NOTE — Progress Notes (Signed)
HPI: FU CAD.She was noted to have coronary calcification on prior CT scan. Echocardiogram in April 2014 showed normal LV function. Last nuclear study July 2018 showed ejection fraction 73%. There was diaphragmatic attenuation but no ischemia.   Patient seen in the emergency room June 2020 with palpitations.  TSH, magnesium, hemoglobin and troponins normal.  Potassium 3.3.  Electrocardiogram and telemetry showed sinus rhythm.  Since last seen,  patient denies dyspnea, chest pain or syncope.  She has had intermittent palpitations.  Current Outpatient Medications  Medication Sig Dispense Refill  . aspirin EC 81 MG tablet Take 1 tablet (81 mg total) by mouth daily. 90 tablet 3  . Calcium-Magnesium-Vitamin D (CALCIUM 500 PO) Takes 3 times daily    . carvedilol (COREG) 6.25 MG tablet TAKE 1 TABLET BY MOUTH TWICE A DAY 180 tablet 3  . Cholecalciferol (VITAMIN D3) 50 MCG (2000 UT) capsule Take 2,000 Units by mouth daily.    Marland Kitchen ezetimibe (ZETIA) 10 MG tablet Take 10 mg by mouth daily.    . hydrochlorothiazide (MICROZIDE) 12.5 MG capsule TAKE 1 CAPSULE BY MOUTH EVERY DAY 90 capsule 3  . levothyroxine (SYNTHROID) 88 MCG tablet Take 88 mcg by mouth daily before breakfast.    . rosuvastatin (CRESTOR) 10 MG tablet Take 10 mg by mouth once a week. ON Sunday.    Marland Kitchen UNABLE TO FIND aspirin 81 mg effervescent tablet    . UNABLE TO FIND Vitamin D3 1,000 unit capsule     No current facility-administered medications for this visit.      Past Medical History:  Diagnosis Date  . CAD (coronary artery disease)   . Hyperlipidemia   . Hypertension   . Hypothyroid     Past Surgical History:  Procedure Laterality Date  . laproscopic endometrial surgrey      Social History   Socioeconomic History  . Marital status: Married    Spouse name: Not on file  . Number of children: 2  . Years of education: Not on file  . Highest education level: Not on file  Occupational History  . Not on file  Social  Needs  . Financial resource strain: Not on file  . Food insecurity    Worry: Not on file    Inability: Not on file  . Transportation needs    Medical: Not on file    Non-medical: Not on file  Tobacco Use  . Smoking status: Never Smoker  . Smokeless tobacco: Never Used  Substance and Sexual Activity  . Alcohol use: No  . Drug use: Not on file  . Sexual activity: Not on file  Lifestyle  . Physical activity    Days per week: Not on file    Minutes per session: Not on file  . Stress: Not on file  Relationships  . Social Herbalist on phone: Not on file    Gets together: Not on file    Attends religious service: Not on file    Active member of club or organization: Not on file    Attends meetings of clubs or organizations: Not on file    Relationship status: Not on file  . Intimate partner violence    Fear of current or ex partner: Not on file    Emotionally abused: Not on file    Physically abused: Not on file    Forced sexual activity: Not on file  Other Topics Concern  . Not on file  Social History  Narrative  . Not on file    Family History  Problem Relation Age of Onset  . Heart attack Mother   . Heart disease Mother   . Stroke Mother   . Diabetes Father   . Heart failure Father   . Breast cancer Neg Hx     ROS: no fevers or chills, productive cough, hemoptysis, dysphasia, odynophagia, melena, hematochezia, dysuria, hematuria, rash, seizure activity, orthopnea, PND, pedal edema, claudication. Remaining systems are negative.  Physical Exam: Well-developed well-nourished in no acute distress.  Skin is warm and dry.  HEENT is normal.  Neck is supple.  Chest is clear to auscultation with normal expansion.  Cardiovascular exam is regular rate and rhythm.  Abdominal exam nontender or distended. No masses palpated. Extremities show no edema. neuro grossly intact  ECG-April 05, 2019-sinus rhythm with no ST changes.  Personally reviewed  A/P  1  coronary artery disease-coronary calcification has been noted on previous CT scan.  Continue medical therapy with aspirin and statin.  2 hypertension-patient's blood pressure is elevated; increase carvedilol to 12.5 mg twice daily and follow.  3 hyperlipidemia-patient is intolerant to high-dose statin.  She will continue on low-dose Crestor and Zetia.  4 palpitations-repeat echocardiogram.  Increase carvedilol to 12.5 mg twice daily.  If symptoms persist we will arrange an event monitor.  Kirk Ruths, MD

## 2019-05-17 ENCOUNTER — Encounter: Payer: Self-pay | Admitting: Cardiology

## 2019-05-17 ENCOUNTER — Ambulatory Visit (INDEPENDENT_AMBULATORY_CARE_PROVIDER_SITE_OTHER): Payer: Medicare Other | Admitting: Cardiology

## 2019-05-17 ENCOUNTER — Other Ambulatory Visit: Payer: Self-pay

## 2019-05-17 VITALS — BP 140/98 | HR 69 | Temp 97.3°F | Ht 62.0 in | Wt 156.0 lb

## 2019-05-17 DIAGNOSIS — R002 Palpitations: Secondary | ICD-10-CM | POA: Diagnosis not present

## 2019-05-17 DIAGNOSIS — I10 Essential (primary) hypertension: Secondary | ICD-10-CM

## 2019-05-17 LAB — BASIC METABOLIC PANEL
BUN/Creatinine Ratio: 15 (ref 12–28)
BUN: 13 mg/dL (ref 8–27)
CO2: 24 mmol/L (ref 20–29)
Calcium: 10.1 mg/dL (ref 8.7–10.3)
Chloride: 102 mmol/L (ref 96–106)
Creatinine, Ser: 0.87 mg/dL (ref 0.57–1.00)
GFR calc Af Amer: 80 mL/min/{1.73_m2} (ref >59)
GFR calc non Af Amer: 69 mL/min/{1.73_m2} (ref >59)
Glucose: 86 mg/dL (ref 65–99)
Potassium: 4.5 mmol/L (ref 3.5–5.2)
Sodium: 141 mmol/L (ref 134–144)

## 2019-05-17 MED ORDER — CARVEDILOL 12.5 MG PO TABS: 12.5000 mg | ORAL_TABLET | Freq: Two times a day (BID) | ORAL | 3 refills | Status: DC

## 2019-05-17 NOTE — Patient Instructions (Addendum)
Medication Instructions:  INCREASE CARVEDILOL TO 12.5 MG TWICE DAILY=2 OF THE 6.25 MG TABLETS TWICE DAILY  If you need a refill on your cardiac medications before your next appointment, please call your pharmacy.   Lab work: Your physician recommends that you HAVE LAB WORK TODAY  If you have labs (blood work) drawn today and your tests are completely normal, you will receive your results only by: Marland Kitchen MyChart Message (if you have MyChart) OR . A paper copy in the mail If you have any lab test that is abnormal or we need to change your treatment, we will call you to review the results.   TESTING: Your physician has requested that you have an echocardiogram. Echocardiography is a painless test that uses sound waves to create images of your heart. It provides your doctor with information about the size and shape of your heart and how well your heart's chambers and valves are working. This procedure takes approximately one hour. There are no restrictions for this procedure.[place]    Follow-Up: At [facility], you and your health needs are our priority.  As part of our continuing mission to provide you with exceptional heart care, we have created designated Provider Care Teams.  These Care Teams include your primary Cardiologist (physician) and Advanced Practice Providers (APPs -  Physician Assistants and Nurse Practitioners) who all work together to provide you with the care you need, when you need it. You will need a follow up appointment in 6 months.  Please call our office 2 months in advance to schedule this appointment.  You may see Kirk Ruths MD or one of the following Advanced Practice Providers on your designated Care Team:   Kerin Ransom, PA-C [place], [place] . Sande Rives, PA-C

## 2019-06-07 ENCOUNTER — Ambulatory Visit (HOSPITAL_COMMUNITY): Payer: Medicare Other | Attending: Cardiovascular Disease

## 2019-06-07 ENCOUNTER — Other Ambulatory Visit: Payer: Self-pay

## 2019-06-07 DIAGNOSIS — R002 Palpitations: Secondary | ICD-10-CM | POA: Diagnosis not present

## 2019-06-07 MED ORDER — PERFLUTREN LIPID MICROSPHERE
1.0000 mL | INTRAVENOUS | Status: AC | PRN
  Administered 2019-06-07: 1 mL via INTRAVENOUS

## 2019-07-14 DIAGNOSIS — Z23 Encounter for immunization: Secondary | ICD-10-CM | POA: Diagnosis not present

## 2019-08-03 ENCOUNTER — Other Ambulatory Visit: Payer: Self-pay | Admitting: Cardiology

## 2019-09-13 ENCOUNTER — Other Ambulatory Visit: Payer: Self-pay

## 2019-09-13 DIAGNOSIS — Z20828 Contact with and (suspected) exposure to other viral communicable diseases: Secondary | ICD-10-CM | POA: Diagnosis not present

## 2019-09-13 DIAGNOSIS — Z20822 Contact with and (suspected) exposure to covid-19: Secondary | ICD-10-CM

## 2019-09-16 LAB — NOVEL CORONAVIRUS, NAA: SARS-CoV-2, NAA: NOT DETECTED

## 2019-10-18 ENCOUNTER — Other Ambulatory Visit: Payer: Self-pay | Admitting: Internal Medicine

## 2019-10-18 DIAGNOSIS — Z1231 Encounter for screening mammogram for malignant neoplasm of breast: Secondary | ICD-10-CM

## 2019-11-08 DIAGNOSIS — Z1331 Encounter for screening for depression: Secondary | ICD-10-CM | POA: Diagnosis not present

## 2019-11-08 DIAGNOSIS — E039 Hypothyroidism, unspecified: Secondary | ICD-10-CM | POA: Diagnosis not present

## 2019-11-08 DIAGNOSIS — E785 Hyperlipidemia, unspecified: Secondary | ICD-10-CM | POA: Diagnosis not present

## 2019-11-08 DIAGNOSIS — I10 Essential (primary) hypertension: Secondary | ICD-10-CM | POA: Diagnosis not present

## 2019-11-15 DIAGNOSIS — E7849 Other hyperlipidemia: Secondary | ICD-10-CM | POA: Diagnosis not present

## 2019-11-20 NOTE — Progress Notes (Signed)
HPI: FUCAD.She was noted to have coronary calcification on prior CT scan. Last nuclear study July 2018 showed ejection fraction 73%. There was diaphragmatic attenuation but no ischemia.  Echocardiogram August 2020 showed normal LV function, grade 2 diastolic dysfunction. Since last seen,  patient denies dyspnea, chest pain or syncope.  Occasional brief palpitations.  Current Outpatient Medications  Medication Sig Dispense Refill  . aspirin EC 81 MG tablet Take 1 tablet (81 mg total) by mouth daily. 90 tablet 3  . Calcium-Magnesium-Vitamin D (CALCIUM 500 PO) Takes 3 times daily    . carvedilol (COREG) 12.5 MG tablet Take 1 tablet (12.5 mg total) by mouth 2 (two) times daily. 180 tablet 3  . Cholecalciferol (VITAMIN D3) 50 MCG (2000 UT) capsule Take 2,000 Units by mouth daily.    Marland Kitchen ezetimibe (ZETIA) 10 MG tablet Take 10 mg by mouth daily.    . hydrochlorothiazide (MICROZIDE) 12.5 MG capsule TAKE 1 CAPSULE BY MOUTH EVERY DAY 90 capsule 2  . levothyroxine (SYNTHROID) 88 MCG tablet Take 88 mcg by mouth daily before breakfast.    . rosuvastatin (CRESTOR) 10 MG tablet Take 10 mg by mouth once a week. ON Sunday.    Marland Kitchen UNABLE TO FIND aspirin 81 mg effervescent tablet    . UNABLE TO FIND Vitamin D3 1,000 unit capsule     No current facility-administered medications for this visit.     Past Medical History:  Diagnosis Date  . CAD (coronary artery disease)   . Hyperlipidemia   . Hypertension   . Hypothyroid     Past Surgical History:  Procedure Laterality Date  . laproscopic endometrial surgrey      Social History   Socioeconomic History  . Marital status: Married    Spouse name: Not on file  . Number of children: 2  . Years of education: Not on file  . Highest education level: Not on file  Occupational History  . Not on file  Tobacco Use  . Smoking status: Never Smoker  . Smokeless tobacco: Never Used  Substance and Sexual Activity  . Alcohol use: No  . Drug use: Not  on file  . Sexual activity: Not on file  Other Topics Concern  . Not on file  Social History Narrative  . Not on file   Social Determinants of Health   Financial Resource Strain:   . Difficulty of Paying Living Expenses: Not on file  Food Insecurity:   . Worried About Charity fundraiser in the Last Year: Not on file  . Ran Out of Food in the Last Year: Not on file  Transportation Needs:   . Lack of Transportation (Medical): Not on file  . Lack of Transportation (Non-Medical): Not on file  Physical Activity:   . Days of Exercise per Week: Not on file  . Minutes of Exercise per Session: Not on file  Stress:   . Feeling of Stress : Not on file  Social Connections:   . Frequency of Communication with Friends and Family: Not on file  . Frequency of Social Gatherings with Friends and Family: Not on file  . Attends Religious Services: Not on file  . Active Member of Clubs or Organizations: Not on file  . Attends Archivist Meetings: Not on file  . Marital Status: Not on file  Intimate Partner Violence:   . Fear of Current or Ex-Partner: Not on file  . Emotionally Abused: Not on file  . Physically Abused:  Not on file  . Sexually Abused: Not on file    Family History  Problem Relation Age of Onset  . Heart attack Mother   . Heart disease Mother   . Stroke Mother   . Diabetes Father   . Heart failure Father   . Breast cancer Neg Hx     ROS: no fevers or chills, productive cough, hemoptysis, dysphasia, odynophagia, melena, hematochezia, dysuria, hematuria, rash, seizure activity, orthopnea, PND, pedal edema, claudication. Remaining systems are negative.  Physical Exam: Well-developed well-nourished in no acute distress.  Skin is warm and dry.  HEENT is normal.  Neck is supple.  Chest is clear to auscultation with normal expansion.  Cardiovascular exam is regular rate and rhythm.  Abdominal exam nontender or distended. No masses palpated. Extremities show no  edema. neuro grossly intact  A/P  1 coronary artery disease-noted on previous CT demonstrating coronary calcification-continue aspirin and statin.  2 palpitations-symptoms reasonably well controlled.  Continue carvedilol.  3 hypertension-blood pressure elevated.  We will try to increase carvedilol to 12.5 mg twice daily to see if it is better controlled.  She had some dizziness with this in the past but is tolerating 12 point 5 in the morning and 6.2 5 in the evening at present.  4 hyperlipidemia-patient is taking Crestor 10 mg weekly due to myalgias.  She apparently did not tolerate Lipitor in the past.  She discontinued Zetia because of expense.  She would like to avoid Repatha.  We will try pravastatin 40 mg daily to see if she tolerates.  Check lipids and liver in 12 weeks.  Kirk Ruths, MD

## 2019-11-22 ENCOUNTER — Ambulatory Visit: Payer: Medicare Other

## 2019-11-29 ENCOUNTER — Other Ambulatory Visit: Payer: Self-pay

## 2019-11-29 ENCOUNTER — Ambulatory Visit (INDEPENDENT_AMBULATORY_CARE_PROVIDER_SITE_OTHER): Payer: Medicare Other | Admitting: Cardiology

## 2019-11-29 ENCOUNTER — Encounter: Payer: Self-pay | Admitting: Cardiology

## 2019-11-29 VITALS — BP 161/94 | HR 70 | Ht 61.0 in | Wt 155.0 lb

## 2019-11-29 DIAGNOSIS — I10 Essential (primary) hypertension: Secondary | ICD-10-CM

## 2019-11-29 DIAGNOSIS — E7800 Pure hypercholesterolemia, unspecified: Secondary | ICD-10-CM | POA: Diagnosis not present

## 2019-11-29 DIAGNOSIS — R002 Palpitations: Secondary | ICD-10-CM

## 2019-11-29 DIAGNOSIS — I2510 Atherosclerotic heart disease of native coronary artery without angina pectoris: Secondary | ICD-10-CM | POA: Diagnosis not present

## 2019-11-29 MED ORDER — PRAVASTATIN SODIUM 40 MG PO TABS: 40.0000 mg | ORAL_TABLET | Freq: Every evening | ORAL | 3 refills | Status: DC

## 2019-11-29 NOTE — Patient Instructions (Signed)
Medication Instructions:  INCREASE CARVEDILOL TO 12.5 MG TWICE DAILY  STOP ROSUVASTATIN  START PRAVASTATIN 40 MG ONCE DAILY  *If you need a refill on your cardiac medications before your next appointment, please call your pharmacy*  Lab Work: Your physician recommends that you return for lab work in: [place]  If you have labs (blood work) drawn today and your tests are completely normal, you will receive your results only by: Marland Kitchen MyChart Message (if you have MyChart) OR . A paper copy in the mail If you have any lab test that is abnormal or we need to change your treatment, we will call you to review the results.  Follow-Up: At [facility], you and your health needs are our priority.  As part of our continuing mission to provide you with exceptional heart care, we have created designated Provider Care Teams.  These Care Teams include your primary Cardiologist (physician) and Advanced Practice Providers (APPs -  Physician Assistants and Nurse Practitioners) who all work together to provide you with the care you need, when you need it.  Your next appointment:   6 month(s)  The format for your next appointment:   Either In Person or Virtual  Provider:   You may see Kirk Ruths MD or one of the following Advanced Practice Providers on your designated Care Team:    Kerin Ransom, PA-C  [place], [place]  Coletta Memos, 

## 2019-12-14 ENCOUNTER — Ambulatory Visit: Payer: Medicare Other

## 2019-12-15 ENCOUNTER — Ambulatory Visit: Payer: Medicare Other | Attending: Internal Medicine

## 2019-12-15 DIAGNOSIS — Z23 Encounter for immunization: Secondary | ICD-10-CM | POA: Insufficient documentation

## 2019-12-15 NOTE — Progress Notes (Signed)
   Covid-19 Vaccination Clinic  Name:  Casey Kramer    MRN: [id number] DOB: 03/08/1951  12/15/2019  Casey Kramer was observed post Covid-19 immunization for 15 minutes without incident. She was provided with Vaccine Information Sheet and instruction to access the V-Safe system.   Casey Kramer was instructed to call 911 with any severe reactions post vaccine: Marland Kitchen Difficulty breathing  . Swelling of face and throat  . A fast heartbeat  . A bad rash all over body  . Dizziness and weakness   Immunizations Administered    Name Date Dose VIS Date Route   Moderna COVID-19 Vaccine 12/15/2019  8:27 AM 0.5 mL 09/12/2019 Intramuscular   Manufacturer: Moderna   Lot: [id number]   [id number]

## 2019-12-25 ENCOUNTER — Ambulatory Visit: Payer: Medicare Other

## 2020-01-17 ENCOUNTER — Ambulatory Visit: Payer: Medicare Other | Attending: Internal Medicine

## 2020-01-17 DIAGNOSIS — Z23 Encounter for immunization: Secondary | ICD-10-CM

## 2020-01-17 NOTE — Progress Notes (Signed)
   Covid-19 Vaccination Clinic  Name:  Casey Kramer    MRN: [id number] DOB: 11-Oct-1951  01/17/2020  Casey Kramer was observed post Covid-19 immunization for 15 minutes without incident. She was provided with Vaccine Information Sheet and instruction to access the V-Safe system.   Casey Kramer was instructed to call 911 with any severe reactions post vaccine: Marland Kitchen Difficulty breathing  . Swelling of face and throat  . A fast heartbeat  . A bad rash all over body  . Dizziness and weakness   Immunizations Administered    Name Date Dose VIS Date Route   Moderna COVID-19 Vaccine 01/17/2020  8:03 AM 0.5 mL 09/12/2019 Intramuscular   Manufacturer: Levan Hurst   [id number]   [id number]

## 2020-02-27 ENCOUNTER — Encounter: Payer: Self-pay | Admitting: *Deleted

## 2020-02-28 ENCOUNTER — Other Ambulatory Visit: Payer: Self-pay

## 2020-02-28 ENCOUNTER — Ambulatory Visit
Admission: RE | Admit: 2020-02-28 | Discharge: 2020-02-28 | Disposition: A | Discharge status: Home / Self Care | Payer: Medicare Other | Source: Ambulatory Visit | Attending: Internal Medicine | Admitting: Internal Medicine

## 2020-02-28 DIAGNOSIS — Z1231 Encounter for screening mammogram for malignant neoplasm of breast: Secondary | ICD-10-CM | POA: Diagnosis not present

## 2020-03-12 ENCOUNTER — Encounter: Payer: Self-pay | Admitting: *Deleted

## 2020-03-18 DIAGNOSIS — E7800 Pure hypercholesterolemia, unspecified: Secondary | ICD-10-CM | POA: Diagnosis not present

## 2020-03-19 LAB — LIPID PANEL
Chol/HDL Ratio: 3.7 ratio (ref 0.0–4.4)
Cholesterol, Total: 260 mg/dL — ABNORMAL HIGH (ref 100–199)
HDL: 71 mg/dL (ref >39)
LDL Chol Calc (NIH): 175 mg/dL — ABNORMAL HIGH (ref 0–99)
Triglycerides: 81 mg/dL (ref 0–149)
VLDL Cholesterol Cal: 14 mg/dL (ref 5–40)

## 2020-03-19 LAB — HEPATIC FUNCTION PANEL
ALT: 13 IU/L (ref 0–32)
AST: 18 IU/L (ref 0–40)
Albumin: 4.7 g/dL (ref 3.8–4.8)
Alkaline Phosphatase: 98 IU/L (ref 48–121)
Bilirubin Total: 0.4 mg/dL (ref 0.0–1.2)
Bilirubin, Direct: 0.1 mg/dL (ref 0.00–0.40)
Total Protein: 7.6 g/dL (ref 6.0–8.5)

## 2020-04-10 ENCOUNTER — Ambulatory Visit (INDEPENDENT_AMBULATORY_CARE_PROVIDER_SITE_OTHER): Payer: Medicare Other | Admitting: Pharmacist

## 2020-04-10 ENCOUNTER — Other Ambulatory Visit: Payer: Self-pay

## 2020-04-10 DIAGNOSIS — I2510 Atherosclerotic heart disease of native coronary artery without angina pectoris: Secondary | ICD-10-CM | POA: Diagnosis not present

## 2020-04-10 DIAGNOSIS — E7849 Other hyperlipidemia: Secondary | ICD-10-CM | POA: Diagnosis not present

## 2020-04-10 MED ORDER — EZETIMIBE 10 MG PO TABS: 10.0000 mg | ORAL_TABLET | Freq: Every day | ORAL | 3 refills | Status: DC

## 2020-04-10 MED ORDER — ROSUVASTATIN CALCIUM 5 MG PO TABS: 5.0000 mg | ORAL_TABLET | ORAL | 3 refills | Status: DC

## 2020-04-10 NOTE — Patient Instructions (Addendum)
It was a pleasure to meet you!  Please start taking rosuvastatin (crestor) 5mg  three times a week Start taking zetia 10mg  daily (call me if there is a cost issue)  Call me if there is any concerns [phone]  We will repeat lipid panel at appointment with Dr. Stanford Breed

## 2020-04-10 NOTE — Progress Notes (Signed)
Patient ID: Casey Kramer                 DOB: 1951/03/26                    MRN: [id number]     HPI: Casey Kramer is a 69 y.o. female patient referred to lipid clinic by [facility] PMH is significant for CAD, HTN, HLD, hypothyroidism. Was previously on rosuvastatin 5mg  twice a week and zetia 10mg . Has been hesistant about PCSK9i. Was seen by our lipid clinic at [place] office about 3 years ago.  Patient presents today to discuss lipid medications. She switched from rosuvastatin 5mg  weekly to pravastatin 40mg  daily in Feb. LDL increased. She tolerated both rosuvastatin weekly and pravastatin well. Tolerated zetia fine as well. Still not interested in PCSK9i. Use to walk a lot with her husband but got out of the routine a while ago. 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 Patient reports that she does not eat a lot, is good about portions.  Current Medications: pravastatin 40mg  daily Intolerances: zetia (cost), atorvastatin (myalgias), simvastatin Risk Factors: CAD found of CT, HTN LDL goal: <70 (due to coronary calcification found on CT)  Diet: breakfast: coffee w/ creamer (teaspoon), cheerios Lunch: nabbs, yogurt Dinner: pasta, pizza (portion controlled) Soda every once and awhile  Exercise: no organized exercise  Family History: Mother with 2 MI and 3 strokes, plus multiple TIAs; first in her early 49's, now 63 (one of 40, 36 still living)             Father with AVreplacement, DM, PAD with stents (this year), now 32             Mother sister had multiple MI (3 puls stroke) died recently at 2,              1 son with DM, htn             1 daughter with high cholesterol, thyroid  Social History:   Labs: 03/18/20 TC 260, TG 81, HDL 71, LDL 175 (pravastatin 40mg  daily)  Past Medical History:  Diagnosis Date  . CAD (coronary artery disease)   . Hyperlipidemia   . Hypertension   . Hypothyroid     Current Outpatient Medications on File Prior to Visit    Medication Sig Dispense Refill  . aspirin EC 81 MG tablet Take 1 tablet (81 mg total) by mouth daily. 90 tablet 3  . Calcium-Magnesium-Vitamin D (CALCIUM 500 PO) Takes 3 times daily    . carvedilol (COREG) 12.5 MG tablet Take 1 tablet (12.5 mg total) by mouth 2 (two) times daily. 180 tablet 3  . Cholecalciferol (VITAMIN D3) 50 MCG (2000 UT) capsule Take 2,000 Units by mouth daily.    Marland Kitchen ezetimibe (ZETIA) 10 MG tablet Take 10 mg by mouth daily.    . hydrochlorothiazide (MICROZIDE) 12.5 MG capsule TAKE 1 CAPSULE BY MOUTH EVERY DAY 90 capsule 2  . levothyroxine (SYNTHROID) 88 MCG tablet Take 88 mcg by mouth daily before breakfast.    . pravastatin (PRAVACHOL) 40 MG tablet Take 1 tablet (40 mg total) by mouth every evening. 90 tablet 3  . UNABLE TO FIND aspirin 81 mg effervescent tablet    . UNABLE TO FIND Vitamin D3 1,000 unit capsule     No current facility-administered medications on file prior to visit.    Allergies  Allergen Reactions  . Statins Other (See Comments)  .  Amlodipine   . Aspirin   . Naproxen Swelling and Other (See Comments)    Lips swell.  . Other Other (See Comments)    Assessment/Plan:  1. Hyperlipidemia - LDL is above goal of <70 (due to CAD seen on CT). Patient had better LDL reduction with rosuvastatin. Since she tolerated once a week well, I have asked the patient to increase rosuvastatin to 5mg  three days a week. Stop pravastatin. Will add zetia 10mg  daily for the best chance at reaching LDL goal. I have also asked the patient to add a walking program into her routine. Recheck lipids at visit with Dr. Stanford Breed on 9/2. If LDL is still above goal, will try increasing rosuvastatin to daily.   Thank you,  Ramond Dial, Pharm.D, BCPS, CPP [place]  [address] [place], [place] [address]  Phone: [phone]; Fax: [phone]

## 2020-04-28 ENCOUNTER — Other Ambulatory Visit: Payer: Self-pay

## 2020-04-28 ENCOUNTER — Encounter (HOSPITAL_COMMUNITY): Payer: Self-pay | Admitting: *Deleted

## 2020-04-28 ENCOUNTER — Emergency Department (HOSPITAL_COMMUNITY)
Admission: EM | Admit: 2020-04-28 | Discharge: 2020-04-28 | Disposition: A | Discharge status: Home / Self Care | Payer: Medicare Other | Attending: Emergency Medicine | Admitting: Emergency Medicine

## 2020-04-28 DIAGNOSIS — I2510 Atherosclerotic heart disease of native coronary artery without angina pectoris: Secondary | ICD-10-CM | POA: Diagnosis not present

## 2020-04-28 DIAGNOSIS — Z79899 Other long term (current) drug therapy: Secondary | ICD-10-CM | POA: Insufficient documentation

## 2020-04-28 DIAGNOSIS — I10 Essential (primary) hypertension: Secondary | ICD-10-CM | POA: Insufficient documentation

## 2020-04-28 DIAGNOSIS — E039 Hypothyroidism, unspecified: Secondary | ICD-10-CM | POA: Diagnosis not present

## 2020-04-28 DIAGNOSIS — R55 Syncope and collapse: Secondary | ICD-10-CM | POA: Insufficient documentation

## 2020-04-28 DIAGNOSIS — R5381 Other malaise: Secondary | ICD-10-CM

## 2020-04-28 LAB — CBC
HCT: 42.7 % (ref 36.0–46.0)
Hemoglobin: 14 g/dL (ref 12.0–15.0)
MCH: 28.9 pg (ref 26.0–34.0)
MCHC: 32.8 g/dL (ref 30.0–36.0)
MCV: 88.2 fL (ref 80.0–100.0)
Platelets: 296 10*3/uL (ref 150–400)
RBC: 4.84 MIL/uL (ref 3.87–5.11)
RDW: 13.6 % (ref 11.5–15.5)
WBC: 12.5 10*3/uL — ABNORMAL HIGH (ref 4.0–10.5)
nRBC: 0 % (ref 0.0–0.2)

## 2020-04-28 LAB — BASIC METABOLIC PANEL
Anion gap: 9 (ref 5–15)
BUN: 34 mg/dL — ABNORMAL HIGH (ref 8–23)
CO2: 28 mmol/L (ref 22–32)
Calcium: 9.3 mg/dL (ref 8.9–10.3)
Chloride: 98 mmol/L (ref 98–111)
Creatinine, Ser: 0.88 mg/dL (ref 0.44–1.00)
GFR calc Af Amer: >60 mL/min (ref >60)
GFR calc non Af Amer: >60 mL/min (ref >60)
Glucose, Bld: 108 mg/dL — ABNORMAL HIGH (ref 70–99)
Potassium: 3.8 mmol/L (ref 3.5–5.1)
Sodium: 135 mmol/L (ref 135–145)

## 2020-04-28 LAB — URINALYSIS, ROUTINE W REFLEX MICROSCOPIC
Bacteria, UA: NONE SEEN
Bilirubin Urine: NEGATIVE
Glucose, UA: NEGATIVE mg/dL
Ketones, ur: NEGATIVE mg/dL
Leukocytes,Ua: NEGATIVE
Nitrite: NEGATIVE
Protein, ur: NEGATIVE mg/dL
Specific Gravity, Urine: 1.009 (ref 1.005–1.030)
pH: 6 (ref 5.0–8.0)

## 2020-04-28 MED ORDER — SODIUM CHLORIDE 0.9% FLUSH: 3.0000 mL | Freq: Once | INTRAVENOUS | Status: DC

## 2020-04-28 NOTE — ED Provider Notes (Signed)
[facility] Provider Note   CSN: [id number] Arrival date & time: 04/28/20  1427     History Chief Complaint  Patient presents with  . Near Syncope    Casey Kramer is a 69 y.o. female.  HPI She presents for evaluation of a tired feeling since awakening this morning.  Later in the day she checked her blood pressure and it was low, 80/40.  She has been eating well.  She completed a treatment course of prednisone for sciatica, yesterday and states that her sciatica is improved.  She denies difficulty eating during treatment with the prednisone.  She denies fever, chills, nausea, vomiting, focal weakness paresthesia.  She has been urinating a lot, and acknowledges that she currently takes hydrochlorothiazide.  She thinks that this is for peripheral edema.  No known sick contacts.  There are no other known modifying factors.    Past Medical History:  Diagnosis Date  . CAD (coronary artery disease)   . Hyperlipidemia   . Hypertension   . Hypothyroid     Patient Active Problem List   Diagnosis Date Noted  . Familial hyperlipidemia 08/24/2017    Past Surgical History:  Procedure Laterality Date  . laproscopic endometrial surgrey       OB History   No obstetric history on file.     Family History  Problem Relation Age of Onset  . Heart attack Mother   . Heart disease Mother   . Stroke Mother   . Diabetes Father   . Heart failure Father   . Breast cancer Neg Hx     Social History   Tobacco Use  . Smoking status: Never Smoker  . Smokeless tobacco: Never Used  Substance Use Topics  . Alcohol use: No  . Drug use: Not on file    Home Medications Prior to Admission medications   Medication Sig Start Date End Date Taking? Authorizing Provider  aspirin EC 81 MG tablet Take 1 tablet (81 mg total) by mouth daily. 08/02/17  Yes Lelon Perla, MD  carvedilol (COREG) 12.5 MG tablet Take 1 tablet (12.5 mg total) by mouth 2 (two) times daily.  05/17/19  Yes Lelon Perla, MD  cholecalciferol (VITAMIN D3) 25 MCG (1000 UNIT) tablet Take 1,000 Units by mouth daily.   Yes [provider]  ezetimibe (ZETIA) 10 MG tablet Take 1 tablet (10 mg total) by mouth daily. 04/10/20 07/09/20 Yes Lelon Perla, MD  hydrochlorothiazide (MICROZIDE) 12.5 MG capsule TAKE 1 CAPSULE BY MOUTH EVERY DAY Patient taking differently: Take 12.5 mg by mouth daily.  08/03/19  Yes Lelon Perla, MD  levothyroxine (SYNTHROID) 88 MCG tablet Take 88 mcg by mouth daily before breakfast.   Yes [provider]  rosuvastatin (CRESTOR) 5 MG tablet Take 1 tablet (5 mg total) by mouth 3 (three) times a week. 04/10/20  Yes Lelon Perla, MD  predniSONE (STERAPRED UNI-PAK 21 TAB) 10 MG (21) TBPK tablet Take by mouth. Patient not taking: Reported on 04/28/2020 04/22/20   [provider]    Allergies    Statins, Amlodipine, Aspirin, and Naproxen  Review of Systems   Review of Systems  All other systems reviewed and are negative.   Physical Exam Updated Vital Signs BP (!) 188/102   Pulse (!) 59   Temp 97.9 F (36.6 C) (Oral)   Resp 16   Ht 5\' 1"  (1.549 m)   Wt 71.2 kg   SpO2 98%   BMI 29.66  kg/m   Physical Exam Vitals and nursing note reviewed.  Constitutional:      General: She is not in acute distress.    Appearance: She is well-developed. She is obese. She is not ill-appearing or diaphoretic.  HENT:     Head: Normocephalic and atraumatic.     Right Ear: External ear normal.     Left Ear: External ear normal.  Eyes:     Conjunctiva/sclera: Conjunctivae normal.     Pupils: Pupils are equal, round, and reactive to light.  Neck:     Trachea: Phonation normal.  Cardiovascular:     Rate and Rhythm: Normal rate and regular rhythm.     Heart sounds: Normal heart sounds.  Pulmonary:     Effort: Pulmonary effort is normal.     Breath sounds: Normal breath sounds.  Abdominal:     General: There is no distension.      Palpations: Abdomen is soft.     Tenderness: There is no abdominal tenderness.  Musculoskeletal:        General: Normal range of motion.     Cervical back: Normal range of motion and neck supple.     Right lower leg: Edema present.     Left lower leg: Edema present.     Comments: 1+ lower leg edema, bilaterally.  Skin:    General: Skin is warm and dry.  Neurological:     Mental Status: She is alert and oriented to person, place, and time.     Cranial Nerves: No cranial nerve deficit.     Sensory: No sensory deficit.     Motor: No abnormal muscle tone.     Coordination: Coordination normal.     Comments: No dysarthria or aphasia.  No nystagmus.  Normal strength, arms and legs bilaterally.  Psychiatric:        Mood and Affect: Mood normal.        Behavior: Behavior normal.        Thought Content: Thought content normal.        Judgment: Judgment normal.     ED Results / Procedures / Treatments   Labs (all labs ordered are listed, but only abnormal results are displayed) Labs Reviewed  BASIC METABOLIC PANEL - Abnormal; Notable for the following components:      Result Value   Glucose, Bld 108 (*)    BUN 34 (*)    All other components within normal limits  CBC - Abnormal; Notable for the following components:   WBC 12.5 (*)    All other components within normal limits  URINALYSIS, ROUTINE W REFLEX MICROSCOPIC - Abnormal; Notable for the following components:   Color, Urine STRAW (*)    Hgb urine dipstick MODERATE (*)    All other components within normal limits    EKG EKG Interpretation  Date/Time:  Sunday April 28 2020 18:32:11 EDT Ventricular Rate:  58 PR Interval:    QRS Duration: 86 QT Interval:  418 QTC Calculation: 411 R Axis:   13 Text Interpretation: Sinus rhythm Baseline wander in lead(s) V1 V6 since last tracing no significant change Confirmed by Daleen Bo [phone]) on 04/28/2020 6:35:52 PM   Radiology No results found.  Procedures Procedures  (including critical care time)  Medications Ordered in ED Medications  sodium chloride flush (NS) 0.9 % injection 3 mL (3 mLs Intravenous Not Given 04/28/20 1900)    ED Course  I have reviewed the triage vital signs and the nursing notes.  Pertinent labs &  imaging results that were available during my care of the patient were reviewed by me and considered in my medical decision making (see chart for details).  Clinical Course as of Apr 28 2006  Sun Apr 28, 2020  2004 Normal except glucose high, BUN high  Basic metabolic panel(!) [EW]  5374 Normal except white count high  CBC(!) [EW]  2005 Normal except presence of hemoglobin  Urinalysis, Routine w reflex microscopic(!) [EW]    Clinical Course User Index [EW] Daleen Bo, MD   MDM Rules/Calculators/A&P                           Patient Vitals for the past 24 hrs:  BP Temp Temp src Pulse Resp SpO2 Height Weight  04/28/20 1859 (!) 188/102 -- -- (!) 59 16 98 % -- --  04/28/20 1851 (!) 180/93 -- -- (!) 58 12 98 % -- --  04/28/20 1459 -- -- -- -- -- -- 5\' 1"  (1.549 m) 71.2 kg  04/28/20 1458 (!) 163/84 97.9 F (36.6 C) Oral 63 20 99 % -- --    8:55 PM Reevaluation with update and discussion. After initial assessment and treatment, an updated evaluation reveals she has been able ambulate easily without dizziness or weakness.  Findings discussed with the patient and all questions were answered. Daleen Bo   Medical Decision Making:  This patient is presenting for evaluation of low blood pressure and tiredness, which does require a range of treatment options, and is a complaint that involves a moderate risk of morbidity and mortality. The differential diagnoses include volume dilution, metabolic instability, serious infection or neurologic disorder. I decided to review old records, and in summary elderly female with history of hypertension, and elevated cholesterol.  I did not require additional historical information from  anyone.  Clinical Laboratory Tests Ordered, included CBC, Metabolic panel and Urinalysis. Review indicates these tests are normal.   Cardiac Monitor Tracing which shows normal sinus rhythm    Critical Interventions-clinical evaluation, laboratory testing, EKG, observation reassessment  After These Interventions, the Patient was reevaluated and was found stable for discharge.  Ambulates normally.  Blood pressures been elevated here with negative orthostatics.  Screening for serious problems have been negative.  Doubt ACS, PE, pneumonia, serious bacterial infection or metabolic instability.  Doubt CVA.  CRITICAL CARE-no Performed by: Daleen Bo  Nursing Notes Reviewed/ Care Coordinated Applicable Imaging Reviewed Interpretation of Laboratory Data incorporated into ED treatment  The patient appears reasonably screened and/or stabilized for discharge and I doubt any other medical condition or other [facility] requiring further screening, evaluation, or treatment in the ED at this time prior to discharge.  Plan: Home Medications-continue usual; Home Treatments-low-salt diet; return here if the recommended treatment, does not improve the symptoms; Recommended follow up-PCP checkup for blood pressure monitoring in 3 days.     Final Clinical Impression(s) / ED Diagnoses Final diagnoses:  None    Rx / DC Orders ED Discharge Orders    None       Daleen Bo, MD 04/28/20 2103

## 2020-04-28 NOTE — ED Notes (Signed)
Pt ambulated around the nurses station without dizziness.

## 2020-04-28 NOTE — Discharge Instructions (Addendum)
The testing today did not show any problems.  Your blood pressure was actually elevated today not low, like it was at home.  It is more likely that your pressure is high needs to be followed by your primary care doctor, than a low blood pressure causing fatigue.  Make sure you are getting plenty of rest and eating 3 meals each day.  Try to stay on a low-salt diet.  Have your doctor check your blood pressure in 3 or 4 days.  Take all of your medicines, as directed.

## 2020-04-28 NOTE — ED Notes (Signed)
Pt ambulated to the bathroom with no assistance.

## 2020-04-28 NOTE — ED Triage Notes (Signed)
Patient presents to the ED with a syncopal episode today at home.  Patient took her BP to which it was 83/43.   Patient reports just finishing prednisone for sciatica.

## 2020-05-02 DIAGNOSIS — E038 Other specified hypothyroidism: Secondary | ICD-10-CM | POA: Diagnosis not present

## 2020-05-02 DIAGNOSIS — E7849 Other hyperlipidemia: Secondary | ICD-10-CM | POA: Diagnosis not present

## 2020-05-08 DIAGNOSIS — E038 Other specified hypothyroidism: Secondary | ICD-10-CM | POA: Diagnosis not present

## 2020-05-08 DIAGNOSIS — M5416 Radiculopathy, lumbar region: Secondary | ICD-10-CM | POA: Diagnosis not present

## 2020-05-08 DIAGNOSIS — Z0000 Encounter for general adult medical examination without abnormal findings: Secondary | ICD-10-CM | POA: Diagnosis not present

## 2020-05-08 DIAGNOSIS — R82998 Other abnormal findings in urine: Secondary | ICD-10-CM | POA: Diagnosis not present

## 2020-05-08 DIAGNOSIS — I10 Essential (primary) hypertension: Secondary | ICD-10-CM | POA: Diagnosis not present

## 2020-05-08 DIAGNOSIS — E7849 Other hyperlipidemia: Secondary | ICD-10-CM | POA: Diagnosis not present

## 2020-05-15 ENCOUNTER — Other Ambulatory Visit: Payer: Self-pay | Admitting: Cardiology

## 2020-05-15 DIAGNOSIS — I10 Essential (primary) hypertension: Secondary | ICD-10-CM

## 2020-05-16 DIAGNOSIS — Z1212 Encounter for screening for malignant neoplasm of rectum: Secondary | ICD-10-CM | POA: Diagnosis not present

## 2020-05-22 DIAGNOSIS — R030 Elevated blood-pressure reading, without diagnosis of hypertension: Secondary | ICD-10-CM | POA: Diagnosis not present

## 2020-05-24 DIAGNOSIS — R030 Elevated blood-pressure reading, without diagnosis of hypertension: Secondary | ICD-10-CM | POA: Diagnosis not present

## 2020-06-10 NOTE — Progress Notes (Signed)
HPI: Casey Kramer.She was noted to have coronary calcification on prior CT scan. Last nuclear study July 2018 showed ejection fraction 73%. There was diaphragmatic attenuation but no ischemia.  Echocardiogram August 2020 showed normal LV function, grade 2 diastolic dysfunction.  Seen in the emergency room July 2021 with malaise and possible low BP.  Patient was not orthostatic by report but was hypertensive.  BUN 34 and creatinine 0.88.  Hemoglobin 14.  Since last seen,  Current Outpatient Medications  Medication Sig Dispense Refill  . aspirin EC 81 MG tablet Take 1 tablet (81 mg total) by mouth daily. 90 tablet 3  . carvedilol (COREG) 12.5 MG tablet TAKE 1 TABLET (12.5 MG TOTAL) BY MOUTH 2 (TWO) TIMES DAILY. 180 tablet 3  . cholecalciferol (VITAMIN D3) 25 MCG (1000 UNIT) tablet Take 1,000 Units by mouth daily.    Marland Kitchen ezetimibe (ZETIA) 10 MG tablet Take 1 tablet (10 mg total) by mouth daily. 90 tablet 3  . hydrochlorothiazide (MICROZIDE) 12.5 MG capsule TAKE 1 CAPSULE BY MOUTH EVERY DAY (Patient taking differently: Take 12.5 mg by mouth daily. ) 90 capsule 2  . levothyroxine (SYNTHROID) 88 MCG tablet Take 88 mcg by mouth daily before breakfast.    . rosuvastatin (CRESTOR) 5 MG tablet Take 1 tablet (5 mg total) by mouth 3 (three) times a week. 40 tablet 3   No current facility-administered medications for this visit.     Past Medical History:  Diagnosis Date  . CAD (coronary artery disease)   . Hyperlipidemia   . Hypertension   . Hypothyroid     Past Surgical History:  Procedure Laterality Date  . laproscopic endometrial surgrey      Social History   Socioeconomic History  . Marital status: Married    Spouse name: Not on file  . Number of children: 2  . Years of education: Not on file  . Highest education level: Not on file  Occupational History  . Not on file  Tobacco Use  . Smoking status: Never Smoker  . Smokeless tobacco: Never Used  Substance and Sexual Activity  .  Alcohol use: No  . Drug use: Not on file  . Sexual activity: Not on file  Other Topics Concern  . Not on file  Social History Narrative  . Not on file   Social Determinants of Health   Financial Resource Strain:   . Difficulty of Paying Living Expenses: Not on file  Food Insecurity:   . Worried About Charity fundraiser in the Last Year: Not on file  . Ran Out of Food in the Last Year: Not on file  Transportation Needs:   . Lack of Transportation (Medical): Not on file  . Lack of Transportation (Non-Medical): Not on file  Physical Activity:   . Days of Exercise per Week: Not on file  . Minutes of Exercise per Session: Not on file  Stress:   . Feeling of Stress : Not on file  Social Connections:   . Frequency of Communication with Friends and Family: Not on file  . Frequency of Social Gatherings with Friends and Family: Not on file  . Attends Religious Services: Not on file  . Active Member of Clubs or Organizations: Not on file  . Attends Archivist Meetings: Not on file  . Marital Status: Not on file  Intimate Partner Violence:   . Fear of Current or Ex-Partner: Not on file  . Emotionally Abused: Not on file  .  Physically Abused: Not on file  . Sexually Abused: Not on file    Family History  Problem Relation Age of Onset  . Heart attack Mother   . Heart disease Mother   . Stroke Mother   . Diabetes Father   . Heart failure Father   . Breast cancer Neg Hx     ROS: no fevers or chills, productive cough, hemoptysis, dysphasia, odynophagia, melena, hematochezia, dysuria, hematuria, rash, seizure activity, orthopnea, PND, pedal edema, claudication. Remaining systems are negative.  Physical Exam: Well-developed well-nourished in no acute distress.  Skin is warm and dry.  HEENT is normal.  Neck is supple.  Chest is clear to auscultation with normal expansion.  Cardiovascular exam is regular rate and rhythm.  Abdominal exam nontender or distended. No masses  palpated. Extremities show no edema. neuro grossly intact  ECG-April 28, 2020-sinus rhythm with nonspecific ST changes.  Personally reviewed  A/P  1 coronary artery disease-patient denies chest pain.  Diagnosis is based on CT demonstrating coronary calcification.  Continue medical therapy with aspirin and statin.  2 hypertension-patient's blood pressure is elevated; however she follows this at home and typically controlled.  Continue present medications and follow.  3 hyperlipidemia-recent LDL June 2021 175.  4 palpitations-plan to continue beta-blocker at present dose.   Kirk Ruths, MD

## 2020-06-13 ENCOUNTER — Ambulatory Visit (INDEPENDENT_AMBULATORY_CARE_PROVIDER_SITE_OTHER): Payer: Medicare Other | Admitting: Cardiology

## 2020-06-13 ENCOUNTER — Encounter: Payer: Self-pay | Admitting: Cardiology

## 2020-06-13 ENCOUNTER — Other Ambulatory Visit: Payer: Self-pay

## 2020-06-13 VITALS — BP 138/82 | HR 75 | Ht 61.0 in | Wt 159.6 lb

## 2020-06-13 DIAGNOSIS — E7800 Pure hypercholesterolemia, unspecified: Secondary | ICD-10-CM | POA: Diagnosis not present

## 2020-06-13 DIAGNOSIS — I2510 Atherosclerotic heart disease of native coronary artery without angina pectoris: Secondary | ICD-10-CM | POA: Diagnosis not present

## 2020-06-13 DIAGNOSIS — I10 Essential (primary) hypertension: Secondary | ICD-10-CM

## 2020-06-13 NOTE — Patient Instructions (Signed)
Medication Instructions:  NO CHANGE *If you need a refill on your cardiac medications before your next appointment, please call your pharmacy*   Follow-Up: At [facility], you and your health needs are our priority.  As part of our continuing mission to provide you with exceptional heart care, we have created designated Provider Care Teams.  These Care Teams include your primary Cardiologist (physician) and Advanced Practice Providers (APPs -  Physician Assistants and Nurse Practitioners) who all work together to provide you with the care you need, when you need it.  We recommend signing up for the patient portal called "MyChart".  Sign up information is provided on this After Visit Summary.  MyChart is used to connect with patients for Virtual Visits (Telemedicine).  Patients are able to view lab/test results, encounter notes, upcoming appointments, etc.  Non-urgent messages can be sent to your provider as well.   To learn more about what you can do with MyChart, go to [email or web address].    Your next appointment:   12 month(s)  The format for your next appointment:   In Person  Provider:   You may see Kirk Ruths MD or one of the following Advanced Practice Providers on your designated Care Team:    Kerin Ransom, PA-C  [place], [place]  Coletta Memos, [place]

## 2020-06-24 ENCOUNTER — Telehealth: Payer: Self-pay

## 2020-06-24 DIAGNOSIS — E7849 Other hyperlipidemia: Secondary | ICD-10-CM

## 2020-06-24 NOTE — Telephone Encounter (Signed)
Called and lmomed the pt to come and complete labs fasting and to call us if they have any questions

## 2020-06-24 NOTE — Telephone Encounter (Signed)
-----   Message from Ramond Dial, [place] sent at 06/24/2020  7:23 AM EDT ----- Can you see if pt is taking her cholesterol medication and if she is-schedule her for labs thanks ----- Message ----- From: Ramond Dial, RPH-CPP Sent: 06/14/2020 To: Ramond Dial, RPH-CPP  lipids

## 2020-07-01 ENCOUNTER — Other Ambulatory Visit: Payer: Self-pay

## 2020-07-01 DIAGNOSIS — E7849 Other hyperlipidemia: Secondary | ICD-10-CM | POA: Diagnosis not present

## 2020-07-01 LAB — HEPATIC FUNCTION PANEL
ALT: 11 IU/L (ref 0–32)
AST: 18 IU/L (ref 0–40)
Albumin: 4.6 g/dL (ref 3.8–4.8)
Alkaline Phosphatase: 80 IU/L (ref 44–121)
Bilirubin Total: 0.5 mg/dL (ref 0.0–1.2)
Bilirubin, Direct: 0.14 mg/dL (ref 0.00–0.40)
Total Protein: 6.9 g/dL (ref 6.0–8.5)

## 2020-07-01 LAB — LIPID PANEL
Chol/HDL Ratio: 2.9 ratio (ref 0.0–4.4)
Cholesterol, Total: 179 mg/dL (ref 100–199)
HDL: 61 mg/dL (ref >39)
LDL Chol Calc (NIH): 103 mg/dL — ABNORMAL HIGH (ref 0–99)
Triglycerides: 84 mg/dL (ref 0–149)
VLDL Cholesterol Cal: 15 mg/dL (ref 5–40)

## 2020-07-03 ENCOUNTER — Telehealth: Payer: Self-pay | Admitting: *Deleted

## 2020-07-03 NOTE — Telephone Encounter (Signed)
Spoke with pt, she reports she is currently taking 5 mg M, W and F. She reports taking the 5 mg tablets daily she was not able to tolerate any increase in that medication. She was not able to try the injectable medications because her insurance and co pay was too high. Will forward for dr Stanford Breed review

## 2020-07-03 NOTE — Telephone Encounter (Signed)
Spoke with pt, she is already taking zetia and will continue her current medications.

## 2020-07-03 NOTE — Telephone Encounter (Signed)
Continue present dose; if she is agreeable, add zetia 10 mg daily with lipids and liver 12 weeks Kirk Ruths

## 2020-07-03 NOTE — Telephone Encounter (Signed)
-----   Message from Lelon Perla, MD sent at 07/01/2020  4:00 PM EDT ----- Increase Crestor to 20 mg daily.  Check lipids and liver in 12 weeks. Kirk Ruths

## 2020-07-26 ENCOUNTER — Other Ambulatory Visit: Payer: Self-pay

## 2020-07-26 ENCOUNTER — Emergency Department (HOSPITAL_COMMUNITY)
Admission: EM | Admit: 2020-07-26 | Discharge: 2020-07-26 | Disposition: A | Discharge status: Home / Self Care | Payer: Medicare Other | Attending: Emergency Medicine | Admitting: Emergency Medicine

## 2020-07-26 ENCOUNTER — Emergency Department (HOSPITAL_COMMUNITY): Payer: Medicare Other

## 2020-07-26 ENCOUNTER — Encounter (HOSPITAL_COMMUNITY): Payer: Self-pay | Admitting: Emergency Medicine

## 2020-07-26 DIAGNOSIS — Z79899 Other long term (current) drug therapy: Secondary | ICD-10-CM | POA: Diagnosis not present

## 2020-07-26 DIAGNOSIS — R519 Headache, unspecified: Secondary | ICD-10-CM | POA: Diagnosis not present

## 2020-07-26 DIAGNOSIS — I2510 Atherosclerotic heart disease of native coronary artery without angina pectoris: Secondary | ICD-10-CM | POA: Insufficient documentation

## 2020-07-26 DIAGNOSIS — E039 Hypothyroidism, unspecified: Secondary | ICD-10-CM | POA: Diagnosis not present

## 2020-07-26 DIAGNOSIS — R112 Nausea with vomiting, unspecified: Secondary | ICD-10-CM | POA: Insufficient documentation

## 2020-07-26 DIAGNOSIS — H8113 Benign paroxysmal vertigo, bilateral: Secondary | ICD-10-CM | POA: Insufficient documentation

## 2020-07-26 DIAGNOSIS — Z7982 Long term (current) use of aspirin: Secondary | ICD-10-CM | POA: Diagnosis not present

## 2020-07-26 DIAGNOSIS — I10 Essential (primary) hypertension: Secondary | ICD-10-CM | POA: Diagnosis not present

## 2020-07-26 DIAGNOSIS — R42 Dizziness and giddiness: Secondary | ICD-10-CM | POA: Diagnosis not present

## 2020-07-26 DIAGNOSIS — H8110 Benign paroxysmal vertigo, unspecified ear: Secondary | ICD-10-CM | POA: Diagnosis not present

## 2020-07-26 LAB — URINALYSIS, ROUTINE W REFLEX MICROSCOPIC
Bacteria, UA: NONE SEEN
Bilirubin Urine: NEGATIVE
Glucose, UA: NEGATIVE mg/dL
Ketones, ur: NEGATIVE mg/dL
Leukocytes,Ua: NEGATIVE
Nitrite: NEGATIVE
Protein, ur: NEGATIVE mg/dL
Specific Gravity, Urine: 1.011 (ref 1.005–1.030)
pH: 7 (ref 5.0–8.0)

## 2020-07-26 LAB — COMPREHENSIVE METABOLIC PANEL
ALT: 18 U/L (ref 0–44)
AST: 19 U/L (ref 15–41)
Albumin: 4.4 g/dL (ref 3.5–5.0)
Alkaline Phosphatase: 71 U/L (ref 38–126)
Anion gap: 6 (ref 5–15)
BUN: 22 mg/dL (ref 8–23)
CO2: 25 mmol/L (ref 22–32)
Calcium: 9.8 mg/dL (ref 8.9–10.3)
Chloride: 102 mmol/L (ref 98–111)
Creatinine, Ser: 0.84 mg/dL (ref 0.44–1.00)
GFR, Estimated: >60 mL/min (ref >60)
Glucose, Bld: 112 mg/dL — ABNORMAL HIGH (ref 70–99)
Potassium: 3.1 mmol/L — ABNORMAL LOW (ref 3.5–5.1)
Sodium: 133 mmol/L — ABNORMAL LOW (ref 135–145)
Total Bilirubin: 0.8 mg/dL (ref 0.3–1.2)
Total Protein: 8 g/dL (ref 6.5–8.1)

## 2020-07-26 LAB — CBC WITH DIFFERENTIAL/PLATELET
Abs Immature Granulocytes: 0.02 10*3/uL (ref 0.00–0.07)
Basophils Absolute: 0 10*3/uL (ref 0.0–0.1)
Basophils Relative: 1 %
Eosinophils Absolute: 0.1 10*3/uL (ref 0.0–0.5)
Eosinophils Relative: 1 %
HCT: 40.5 % (ref 36.0–46.0)
Hemoglobin: 13.5 g/dL (ref 12.0–15.0)
Immature Granulocytes: 0 %
Lymphocytes Relative: 14 %
Lymphs Abs: 0.9 10*3/uL (ref 0.7–4.0)
MCH: 29 pg (ref 26.0–34.0)
MCHC: 33.3 g/dL (ref 30.0–36.0)
MCV: 87.1 fL (ref 80.0–100.0)
Monocytes Absolute: 0.3 10*3/uL (ref 0.1–1.0)
Monocytes Relative: 4 %
Neutro Abs: 5 10*3/uL (ref 1.7–7.7)
Neutrophils Relative %: 80 %
Platelets: 207 10*3/uL (ref 150–400)
RBC: 4.65 MIL/uL (ref 3.87–5.11)
RDW: 13 % (ref 11.5–15.5)
WBC: 6.3 10*3/uL (ref 4.0–10.5)
nRBC: 0 % (ref 0.0–0.2)

## 2020-07-26 LAB — TROPONIN I (HIGH SENSITIVITY)
Troponin I (High Sensitivity): 3 ng/L (ref <18)
Troponin I (High Sensitivity): 3 ng/L (ref <18)

## 2020-07-26 LAB — MAGNESIUM: Magnesium: 2 mg/dL (ref 1.7–2.4)

## 2020-07-26 MED ORDER — POTASSIUM CHLORIDE 10 MEQ/100ML IV SOLN
10.0000 meq | Freq: Once | INTRAVENOUS | Status: AC
  Administered 2020-07-26: 10 meq via INTRAVENOUS
  Filled 2020-07-26: qty 100

## 2020-07-26 MED ORDER — POTASSIUM CHLORIDE CRYS ER 20 MEQ PO TBCR
40.0000 meq | EXTENDED_RELEASE_TABLET | Freq: Once | ORAL | Status: DC
  Filled 2020-07-26: qty 2

## 2020-07-26 MED ORDER — SODIUM CHLORIDE 0.9 % IV BOLUS
500.0000 mL | Freq: Once | INTRAVENOUS | Status: AC
  Administered 2020-07-26: 500 mL via INTRAVENOUS

## 2020-07-26 MED ORDER — MECLIZINE HCL 12.5 MG PO TABS
25.0000 mg | ORAL_TABLET | Freq: Once | ORAL | Status: AC
  Administered 2020-07-26: 25 mg via ORAL
  Filled 2020-07-26: qty 2

## 2020-07-26 MED ORDER — MECLIZINE HCL 25 MG PO TABS: 25.0000 mg | ORAL_TABLET | Freq: Four times a day (QID) | ORAL | 0 refills | Status: AC | PRN

## 2020-07-26 NOTE — ED Notes (Signed)
ED Provider at bedside. 

## 2020-07-26 NOTE — ED Triage Notes (Signed)
Pt reports dizziness upon waking this am. Pt reports went to bed last night around midnight and reports felt tired but denies any symptoms last night. Pt reports emesis this am x3. Pt reports intermittent headache denies any numbness tingling in extremities. Facial symmetry intact, speech clear. Gait steady. Airway patent. Pt denies any known injury.

## 2020-07-26 NOTE — Discharge Instructions (Addendum)
Please follow-up with your primary care provider regarding today's encounter and for ongoing evaluation and management.  You likely have benign paroxysmal positional vertigo.  You would benefit from continued meclizine medications as well as performing Epley maneuver.  Please YouTube "Epley Maneuver" or read the instructions provided.  You may also benefit from referral to ENT and/or vestibular rehab if your symptoms fail to improve with conservative therapy.  Your potassium was also low.  I suspect that this may be related to your HCTZ as well as your emesis x3.  Please eat foods rich in potassium such as potatoes and bananas.  You will need to have laboratory recheck to ensure correction of your electrolyte derangement when you follow-up with your primary care provider.  You also have a 3.4 x 2.7 x 2.5 cm parafalcine meningioma.  This does not seem to be contributing to your symptoms today.  However, would like for you to follow-up with your primary care provider regarding this incidental finding.  Return to the ED or seek immediate medical attention should you experience any new or worsening symptoms.

## 2020-07-26 NOTE — ED Provider Notes (Signed)
[facility] EMERGENCY DEPARTMENT Provider Note   CSN: [id number] Arrival date & time: 07/26/20  1001     History Chief Complaint  Patient presents with  . Dizziness    Casey Kramer is a 69 y.o. female with PMH significant for CAD, HTN, and familial hyperlipidemia who presents the ED with complaints of dizziness and emesis x3 shortly upon waking this morning.  LKN last evening.  She felt mildly fatigued, but no other symptoms when she went to bed.    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  She then states that she slept well.  However, after standing up out of bed, she developed room spinning dizziness.  She went into the bathroom to shower and she states that anytime she turns her head a certain way or looked in any particular direction, it would elicit worsening dizziness symptoms.  She states that she is ambulatory without any ataxia.  She even drove herself to the store.  However, her room spinning dizziness is caused her to feel nauseated she has had 3 episodes of nonbloody emesis.  She reports that she has never wants thrown up since she was a small child.  She has never had this happen before.  Patient also is endorsing a headache.  She denies any blurred vision, changes in hearing or tinnitus, chest pain or shortness of breath, abdominal pain, head injury or precipitating trauma, numbness or weakness, new medications, or other symptoms.  HPI     Past Medical History:  Diagnosis Date  . CAD (coronary artery disease)   . Hyperlipidemia   . Hypertension   . Hypothyroid     Patient Active Problem List   Diagnosis Date Noted  . Familial hyperlipidemia 08/24/2017    Past Surgical History:  Procedure Laterality Date  . laproscopic endometrial surgrey       OB History   No obstetric history on file.     Family History  Problem Relation Age of Onset  . Heart  attack Mother   . Heart disease Mother   . Stroke Mother   . Diabetes Father   . Heart failure Father   . Breast cancer Neg Hx     Social History   Tobacco Use  . Smoking status: Never Smoker  . Smokeless tobacco: Never Used  Substance Use Topics  . Alcohol use: No  . Drug use: Not on file    Home Medications Prior to Admission medications   Medication Sig Start Date End Date Taking? Authorizing Provider  aspirin EC 81 MG tablet Take 1 tablet (81 mg total) by mouth daily. 08/02/17   Lelon Perla, MD  carvedilol (COREG) 12.5 MG tablet TAKE 1 TABLET (12.5 MG TOTAL) BY MOUTH 2 (TWO) TIMES DAILY. 05/15/20   Lelon Perla, MD  cholecalciferol (VITAMIN D3) 25 MCG (1000 UNIT) tablet Take 1,000 Units by mouth daily.    [provider]  ezetimibe (ZETIA) 10 MG tablet Take 1 tablet (10 mg total) by mouth daily. 04/10/20 07/09/20  Lelon Perla, MD  hydrochlorothiazide (MICROZIDE) 12.5 MG capsule TAKE 1 CAPSULE BY MOUTH EVERY DAY Patient taking differently: Take 12.5 mg by mouth daily.  08/03/19   Lelon Perla, MD  levothyroxine (SYNTHROID) 88 MCG tablet Take 88 mcg by mouth daily before breakfast.    [provider]  meclizine (ANTIVERT) 25 MG tablet Take 1 tablet (25  mg total) by mouth 4 (four) times daily as needed for dizziness. 07/26/20   Corena Herter, PA-C  rosuvastatin (CRESTOR) 5 MG tablet Take 1 tablet (5 mg total) by mouth 3 (three) times a week. 04/10/20   Lelon Perla, MD    Allergies    Statins, Amlodipine, Aspirin, and Naproxen  Review of Systems   Review of Systems  All other systems reviewed and are negative.   Physical Exam Updated Vital Signs BP (!) 166/85   Pulse 66   Temp 98.7 F (37.1 C) (Oral)   Resp 11   Ht 5\' 1"  (1.549 m)   Wt 70.3 kg   SpO2 97%   BMI 29.29 kg/m   Physical Exam Vitals and nursing note reviewed. Exam conducted with a chaperone present.  Constitutional:      Appearance: Normal appearance.    HENT:     Head: Normocephalic and atraumatic.     Ears:     Comments: Hearing grossly intact and symmetric bilaterally. Eyes:     General: No scleral icterus.    Extraocular Movements: Extraocular movements intact.     Conjunctiva/sclera: Conjunctivae normal.     Pupils: Pupils are equal, round, and reactive to light.     Comments: Horizontal nystagmus noted.  Visual acuity grossly intact.  Cardiovascular:     Rate and Rhythm: Normal rate and regular rhythm.     Pulses: Normal pulses.     Heart sounds: Normal heart sounds.  Pulmonary:     Effort: Pulmonary effort is normal. No respiratory distress.     Breath sounds: Normal breath sounds.  Musculoskeletal:        General: Normal range of motion.  Skin:    General: Skin is dry.     Capillary Refill: Capillary refill takes less than 2 seconds.  Neurological:     General: No focal deficit present.     Mental Status: She is alert and oriented to person, place, and time.     GCS: GCS eye subscore is 4. GCS verbal subscore is 5. GCS motor subscore is 6.     Cranial Nerves: No cranial nerve deficit.     Sensory: No sensory deficit.     Motor: No weakness.     Coordination: Coordination normal.     Gait: Gait normal.     Comments: Positive Dix-Hallpike testing when gazing to the right.  ROM, strength, and sensation intact throughout.  Psychiatric:        Mood and Affect: Mood normal.        Behavior: Behavior normal.        Thought Content: Thought content normal.     ED Results / Procedures / Treatments   Labs (all labs ordered are listed, but only abnormal results are displayed) Labs Reviewed  COMPREHENSIVE METABOLIC PANEL - Abnormal; Notable for the following components:      Result Value   Sodium 133 (*)    Potassium 3.1 (*)    Glucose, Bld 112 (*)    All other components within normal limits  URINALYSIS, ROUTINE W REFLEX MICROSCOPIC - Abnormal; Notable for the following components:   APPearance HAZY (*)    Hgb urine  dipstick SMALL (*)    All other components within normal limits  CBC WITH DIFFERENTIAL/PLATELET  MAGNESIUM  TROPONIN I (HIGH SENSITIVITY)  TROPONIN I (HIGH SENSITIVITY)    EKG EKG Interpretation  Date/Time:  Friday July 26 2020 10:28:14 EDT Ventricular Rate:  63 PR Interval:  QRS Duration: 87 QT Interval:  415 QTC Calculation: 425 R Axis:   -18 Text Interpretation: Sinus rhythm Borderline left axis deviation Nonspecific T abnrm, anterolateral leads No STEMI Confirmed by Nanda Quinton [phone]) on 07/26/2020 10:34:32 AM   Radiology CT Head Wo Contrast  Result Date: 07/26/2020 CLINICAL DATA:  Dizziness with headache EXAM: CT HEAD WITHOUT CONTRAST TECHNIQUE: Contiguous axial images were obtained from the base of the skull through the vertex without intravenous contrast. COMPARISON:  None. FINDINGS: Brain: There is moderate frontal atrophy bilaterally. Ventricles normal in size and configuration. There is a partially calcified mass arising from the posterior falx on the left measuring 3.4 x 2.7 x 2.5 cm, an apparent parafalcine meningioma. There is no appreciable surrounding edema in this region. No other mass evident. No hemorrhage, extra-axial fluid collection, or midline shift. No acute infarct is evident. Vascular: No hyperdense vessel. Foci of calcification in the carotid siphon regions are noted. Skull: Bony calvarium appears intact. Sinuses/Orbits: Visualized paranasal sinuses are clear. Visualized orbits appear symmetric bilaterally. Other: Mastoid air cells are clear. IMPRESSION: 1. Left parafalcine meningioma measuring 3.4 x 2.7 x 2.5 cm, largely calcified. No appreciable surrounding edema. No other mass evident. 2. There is moderate frontal atrophy bilaterally. Ventricles normal in size and configuration. 3. No evident acute infarct. No hemorrhage or extra-axial fluid collection. 4.  There are foci of arterial vascular calcification. Electronically Signed   By: Lowella Grip III  M.D.   On: 07/26/2020 12:34    Procedures Procedures (including critical care time)  Medications Ordered in ED Medications  potassium chloride 10 mEq in 100 mL IVPB (10 mEq Intravenous New Bag/Given 07/26/20 1527)  meclizine (ANTIVERT) tablet 25 mg (25 mg Oral Given 07/26/20 1149)  sodium chloride 0.9 % bolus 500 mL (500 mLs Intravenous New Bag/Given 07/26/20 1525)    ED Course  I have reviewed the triage vital signs and the nursing notes.  Pertinent labs & imaging results that were available during my care of the patient were reviewed by me and considered in my medical decision making (see chart for details).    MDM Rules/Calculators/A&P                          Given patient's cardiac history including familial hyperlipidemia in the context of acute onset dizziness, nausea, and emesis, will obtain cardiac work-up.  EKG is personally reviewed and reassuring.  She is also denying any chest pain or shortness of breath.  Patient also endorses a headache and has never had any imaging conducted before, will obtain CT head without contrast given acute onset nature of her headache and room-spinning dizziness symptoms to evaluate for intracranial hemorrhage, particularly given her elevated blood pressure documented here in the ED.  Physical exam is otherwise reassuring without any focal neurologic deficits.  She is ambulatory without ataxia, visualized by RN.  No numbness or weakness.  She does have horizontal nystagmus and positive Dix-Hallpike testing, suggestive of peripheral vertigo, likely BPPV.  No hearing loss/changes in low suspicion for Mnire's.  Will treat with meclizine.  Labs Troponin: 3 >> CBC with differential: No anemia.  No leukocytosis concerning for infection. CMP: Mild hyponatremia 133.  Hypokalemia to 3.1, will replenish here in the ED with 40 mEq K-Dur. UA: No evidence of infection.  Imaging I personally reviewed CT head without contrast which demonstrates left  parafalcine meningioma measuring 3.4 x 2.7 x 2.5 cm, largely calcified.  No appreciable surrounding edema.  No mass-effect.  No hemorrhage or evidence of acute infarct.  On subsequent evaluation, patient was feeling improved after meclizine.  She was able to ambulate to the bathroom without ataxia or difficulty, however did state that her dizziness was reproduced by bending over to grab the toilet paper.  Her history and physical exam is suggestive of BPPV.  Will prescribe continued meclizine and encourage her to follow-up with her primary care provider.  She may ultimately benefit from referral to ENT or vestibular rehab.  Will provide instructions on how to perform an Epley maneuver.  Her nausea symptoms have entirely improved given her improvement of dizziness with meclizine.  Do not feel as though discharged home with nausea medication is warranted.  She will also notify her primary care provider of her meningioma.  She may require surveillance or other intervention.  Discussed with Dr. Laverta Baltimore who agrees with assessment and plan.  All of the evaluation and work-up results were discussed with the patient and any family at bedside.  Patient and/or family were informed that while patient is appropriate for discharge at this time, some medical emergencies may only develop or become detectable after a period of time.  I specifically instructed patient and/or family to return to return to the ED or seek immediate medical attention for any new or worsening symptoms.  They were provided opportunity to ask any additional questions and have none at this time.  Prior to discharge patient is feeling well, agreeable with plan for discharge home.  They have expressed understanding of verbal discharge instructions as well as return precautions and are agreeable to the plan.    Final Clinical Impression(s) / ED Diagnoses Final diagnoses:  Benign paroxysmal positional vertigo, unspecified laterality    Rx / DC  Orders ED Discharge Orders         Ordered    meclizine (ANTIVERT) 25 MG tablet  4 times daily PRN        07/26/20 1542           Corena Herter, PA-C 07/26/20 1544    Long, Wonda Olds, MD 07/29/20 [phone]

## 2020-08-01 DIAGNOSIS — D496 Neoplasm of unspecified behavior of brain: Secondary | ICD-10-CM | POA: Diagnosis not present

## 2020-08-12 DIAGNOSIS — Z23 Encounter for immunization: Secondary | ICD-10-CM | POA: Diagnosis not present

## 2020-08-30 DIAGNOSIS — H8190 Unspecified disorder of vestibular function, unspecified ear: Secondary | ICD-10-CM | POA: Diagnosis not present

## 2020-10-30 DIAGNOSIS — I10 Essential (primary) hypertension: Secondary | ICD-10-CM | POA: Diagnosis not present

## 2020-10-30 DIAGNOSIS — E785 Hyperlipidemia, unspecified: Secondary | ICD-10-CM | POA: Diagnosis not present

## 2020-10-30 DIAGNOSIS — E663 Overweight: Secondary | ICD-10-CM | POA: Diagnosis not present

## 2020-10-30 DIAGNOSIS — M5416 Radiculopathy, lumbar region: Secondary | ICD-10-CM | POA: Diagnosis not present

## 2020-10-30 DIAGNOSIS — D329 Benign neoplasm of meninges, unspecified: Secondary | ICD-10-CM | POA: Diagnosis not present

## 2020-10-30 DIAGNOSIS — E039 Hypothyroidism, unspecified: Secondary | ICD-10-CM | POA: Diagnosis not present

## 2020-11-06 ENCOUNTER — Other Ambulatory Visit: Payer: Self-pay | Admitting: Cardiology

## 2020-11-19 DIAGNOSIS — H903 Sensorineural hearing loss, bilateral: Secondary | ICD-10-CM | POA: Diagnosis not present

## 2020-11-19 DIAGNOSIS — R42 Dizziness and giddiness: Secondary | ICD-10-CM | POA: Diagnosis not present

## 2020-11-19 DIAGNOSIS — H8190 Unspecified disorder of vestibular function, unspecified ear: Secondary | ICD-10-CM | POA: Diagnosis not present

## 2020-12-30 ENCOUNTER — Telehealth: Payer: Self-pay | Admitting: Cardiology

## 2020-12-30 NOTE — Telephone Encounter (Signed)
Spoke with pt, she reports esp in the evening after taking the second dose of carvedilol she will notice her heart speeding up and skipping. She has stopped the carvedilol evening dosage. She reports no changes in diet or medications. She will try taking 6.25 mg of the carvedilol in the evenings and follow up scheduled next available. She will call prior to appointment with changes or concerns. Pt agreed with this plan.

## 2020-12-30 NOTE — Telephone Encounter (Signed)
Patient c/o Palpitations:  High priority if patient c/o lightheadedness, shortness of breath, or chest pain  1) How long have you had palpitations/irregular HR/ Afib? Are you having the symptoms now? 3 or 4 weeks  2) Are you currently experiencing lightheadedness, SOB or CP? lightheadedness  3) Do you have a history of afib (atrial fibrillation) or irregular heart rhythm? No   4) Have you checked your BP or HR? (document readings if available): 148/82; 65  5) Are you experiencing any other symptoms? No

## 2021-01-25 NOTE — Progress Notes (Signed)
HPI: Casey Kramer.She was noted to have coronary calcification on prior CT scan. Last nuclear study July 2018 showed ejection fraction 73%. There was diaphragmatic attenuation but no ischemia. Echocardiogram August 2020 showed normal LV function, grade 2 diastolic dysfunction. Since last seen,she denies dyspnea, chest pain, palpitations or syncope.  Current Outpatient Medications  Medication Sig Dispense Refill  . aspirin EC 81 MG tablet Take 1 tablet (81 mg total) by mouth daily. 90 tablet 3  . carvedilol (COREG) 12.5 MG tablet TAKE 1 TABLET (12.5 MG TOTAL) BY MOUTH 2 (TWO) TIMES DAILY. 180 tablet 3  . cholecalciferol (VITAMIN D3) 25 MCG (1000 UNIT) tablet Take 1,000 Units by mouth daily.    Marland Kitchen ezetimibe (ZETIA) 10 MG tablet Take 1 tablet (10 mg total) by mouth daily. 90 tablet 3  . hydrochlorothiazide (MICROZIDE) 12.5 MG capsule TAKE 1 CAPSULE BY MOUTH EVERY DAY 90 capsule 2  . levothyroxine (SYNTHROID) 88 MCG tablet Take 88 mcg by mouth daily before breakfast.    . meclizine (ANTIVERT) 25 MG tablet Take 1 tablet (25 mg total) by mouth 4 (four) times daily as needed for dizziness. 30 tablet 0  . rosuvastatin (CRESTOR) 5 MG tablet Take 1 tablet (5 mg total) by mouth 3 (three) times a week. 40 tablet 3   No current facility-administered medications for this visit.     Past Medical History:  Diagnosis Date  . CAD (coronary artery disease)   . Hyperlipidemia   . Hypertension   . Hypothyroid     Past Surgical History:  Procedure Laterality Date  . laproscopic endometrial surgrey      Social History   Socioeconomic History  . Marital status: Married    Spouse name: Not on file  . Number of children: 2  . Years of education: Not on file  . Highest education level: Not on file  Occupational History  . Not on file  Tobacco Use  . Smoking status: Never Smoker  . Smokeless tobacco: Never Used  Substance and Sexual Activity  . Alcohol use: No  . Drug use: Not on file  .  Sexual activity: Not on file  Other Topics Concern  . Not on file  Social History Narrative  . Not on file   Social Determinants of Health   Financial Resource Strain: Not on file  Food Insecurity: Not on file  Transportation Needs: Not on file  Physical Activity: Not on file  Stress: Not on file  Social Connections: Not on file  Intimate Partner Violence: Not on file    Family History  Problem Relation Age of Onset  . Heart attack Mother   . Heart disease Mother   . Stroke Mother   . Diabetes Father   . Heart failure Father   . Breast cancer Neg Hx     ROS: no fevers or chills, productive cough, hemoptysis, dysphasia, odynophagia, melena, hematochezia, dysuria, hematuria, rash, seizure activity, orthopnea, PND, pedal edema, claudication. Remaining systems are negative.  Physical Exam: Well-developed well-nourished in no acute distress.  Skin is warm and dry.  HEENT is normal.  Neck is supple.  Chest is clear to auscultation with normal expansion.  Cardiovascular exam is regular rate and rhythm.  Abdominal exam nontender or distended. No masses palpated. Extremities show no edema. neuro grossly intact  ECG-normal sinus rhythm at a rate of 64, no ST changes.  Personally reviewed  A/P  1 coronary artery disease-patient continues to do well with no chest pain.  Plan to continue aspirin and statin.  Note previous diagnosis was based on CT demonstrating coronary calcification.  2 hypertension-blood pressure elevated; however she follows this at home and it is typically controlled.  Continue present medications and follow.  3 hyperlipidemia-continue low-dose Crestor (she did not tolerate higher doses in the past).  Continue Zetia.  Check lipids and liver.  Patient states that Repatha was too expensive previously.  If LDL not at goal we will reassess.  4 palpitations-symptoms are well controlled.  Continue beta-blocker at present dose.  Kirk Ruths, MD

## 2021-01-28 ENCOUNTER — Other Ambulatory Visit: Payer: Self-pay | Admitting: Internal Medicine

## 2021-01-28 DIAGNOSIS — Z1231 Encounter for screening mammogram for malignant neoplasm of breast: Secondary | ICD-10-CM

## 2021-01-30 ENCOUNTER — Encounter: Payer: Self-pay | Admitting: Cardiology

## 2021-01-30 ENCOUNTER — Other Ambulatory Visit: Payer: Self-pay

## 2021-01-30 ENCOUNTER — Ambulatory Visit (INDEPENDENT_AMBULATORY_CARE_PROVIDER_SITE_OTHER): Payer: Medicare Other | Admitting: Cardiology

## 2021-01-30 VITALS — BP 174/86 | HR 64 | Ht 61.0 in | Wt 158.2 lb

## 2021-01-30 DIAGNOSIS — E7849 Other hyperlipidemia: Secondary | ICD-10-CM

## 2021-01-30 DIAGNOSIS — E7800 Pure hypercholesterolemia, unspecified: Secondary | ICD-10-CM | POA: Diagnosis not present

## 2021-01-30 DIAGNOSIS — I2510 Atherosclerotic heart disease of native coronary artery without angina pectoris: Secondary | ICD-10-CM | POA: Diagnosis not present

## 2021-01-30 DIAGNOSIS — I10 Essential (primary) hypertension: Secondary | ICD-10-CM

## 2021-01-30 NOTE — Patient Instructions (Signed)

## 2021-02-08 ENCOUNTER — Other Ambulatory Visit: Payer: Self-pay

## 2021-02-08 ENCOUNTER — Emergency Department (HOSPITAL_COMMUNITY)
Admission: EM | Admit: 2021-02-08 | Discharge: 2021-02-08 | Disposition: A | Discharge status: Home / Self Care | Payer: Medicare Other | Attending: Emergency Medicine | Admitting: Emergency Medicine

## 2021-02-08 ENCOUNTER — Encounter (HOSPITAL_COMMUNITY): Payer: Self-pay

## 2021-02-08 DIAGNOSIS — Z79899 Other long term (current) drug therapy: Secondary | ICD-10-CM | POA: Insufficient documentation

## 2021-02-08 DIAGNOSIS — Z7982 Long term (current) use of aspirin: Secondary | ICD-10-CM | POA: Insufficient documentation

## 2021-02-08 DIAGNOSIS — I2510 Atherosclerotic heart disease of native coronary artery without angina pectoris: Secondary | ICD-10-CM | POA: Insufficient documentation

## 2021-02-08 DIAGNOSIS — E039 Hypothyroidism, unspecified: Secondary | ICD-10-CM | POA: Insufficient documentation

## 2021-02-08 DIAGNOSIS — I10 Essential (primary) hypertension: Secondary | ICD-10-CM | POA: Insufficient documentation

## 2021-02-08 DIAGNOSIS — R040 Epistaxis: Secondary | ICD-10-CM | POA: Diagnosis not present

## 2021-02-08 MED ORDER — OXYMETAZOLINE HCL 0.05 % NA SOLN
1.0000 | Freq: Once | NASAL | Status: AC
  Administered 2021-02-08: 1 via NASAL
  Filled 2021-02-08: qty 30

## 2021-02-08 NOTE — ED Triage Notes (Signed)
Pt to er, pt states that she woke up this am and felt like she was getting a little bit of a cold, states that she was at work and started having a nose bleed, states that she can't get it to stop bleeding.

## 2021-02-08 NOTE — Discharge Instructions (Signed)
Follow-up with your ENT, call on Monday to schedule an appointment. If bleeding returns, blow out both sides of the nose to remove all of the clots.  Spray Afrin in both sides of your nose.  Pinch the soft part of your nose closed and hold for 15 minutes.  Do not go up pressure for 15 straight minutes.  If you are feeling unwell, have concerns or unable to control the bleeding, return to the emergency room.

## 2021-02-08 NOTE — ED Provider Notes (Signed)
[facility] EMERGENCY DEPARTMENT Provider Note   CSN: [id number] Arrival date & time: 02/08/21  1336     History Chief Complaint  Patient presents with  . Epistaxis    Casey Kramer is a 70 y.o. female.  70 year old female with history of hypertension, hyperlipidemia, coronary artery disease, presents to the ER with nosebleed, onset just prior to arrival today. 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 Patient pinched at the top of her nose and applied an ice pack without improvement so she came to the ER. Reports prior nose bleed in 2015 which had to be cauterized, otherwise does not have frequent nose bleeds.         Past Medical History:  Diagnosis Date  . CAD (coronary artery disease)   . Hyperlipidemia   . Hypertension   . Hypothyroid     Patient Active Problem List   Diagnosis Date Noted  . Familial hyperlipidemia 08/24/2017    Past Surgical History:  Procedure Laterality Date  . laproscopic endometrial surgrey       OB History   No obstetric history on file.     Family History  Problem Relation Age of Onset  . Heart attack Mother   . Heart disease Mother   . Stroke Mother   . Diabetes Father   . Heart failure Father   . Breast cancer Neg Hx     Social History   Tobacco Use  . Smoking status: Never Smoker  . Smokeless tobacco: Never Used  Vaping Use  . Vaping Use: Never used  Substance Use Topics  . Alcohol use: No  . Drug use: Never    Home Medications Prior to Admission medications   Medication Sig Start Date End Date Taking? Authorizing Provider  aspirin EC 81 MG tablet Take 1 tablet (81 mg total) by mouth daily. 08/02/17   Lelon Perla, MD  carvedilol (COREG) 12.5 MG tablet TAKE 1 TABLET (12.5 MG TOTAL) BY MOUTH 2 (TWO) TIMES DAILY. 05/15/20   Lelon Perla, MD  cholecalciferol (VITAMIN D3) 25 MCG (1000 UNIT) tablet Take  1,000 Units by mouth daily.    [provider]  ezetimibe (ZETIA) 10 MG tablet Take 1 tablet (10 mg total) by mouth daily. 04/10/20 07/26/20  Lelon Perla, MD  hydrochlorothiazide (MICROZIDE) 12.5 MG capsule TAKE 1 CAPSULE BY MOUTH EVERY DAY 11/06/20   Lelon Perla, MD  levothyroxine (SYNTHROID) 88 MCG tablet Take 88 mcg by mouth daily before breakfast.    [provider]  meclizine (ANTIVERT) 25 MG tablet Take 1 tablet (25 mg total) by mouth 4 (four) times daily as needed for dizziness. 07/26/20   Corena Herter, PA-C  rosuvastatin (CRESTOR) 5 MG tablet Take 1 tablet (5 mg total) by mouth 3 (three) times a week. 04/10/20   Lelon Perla, MD    Allergies    Statins, Amlodipine, Aspirin, and Naproxen  Review of Systems   Review of Systems  Constitutional: Negative for fever.  HENT: Positive for nosebleeds.   Gastrointestinal: Negative for nausea and vomiting.  Skin: Negative for rash and wound.  Allergic/Immunologic: Negative for immunocompromised state.  Neurological: Negative for dizziness, weakness and headaches.  Hematological: Does not bruise/bleed easily.    Physical Exam Updated Vital Signs BP (!) 155/90 (BP Location: Left Arm)   Pulse 65   Temp 98.5 F (36.9  C)   Resp 18   Ht 5\' 1"  (1.549 m)   Wt 71.2 kg   SpO2 98%   BMI 29.66 kg/m   Physical Exam Vitals and nursing note reviewed.  Constitutional:      General: She is not in acute distress.    Appearance: She is well-developed. She is not diaphoretic.  HENT:     Head: Normocephalic and atraumatic.     Nose:     Comments: Clots os blood on both sides of the nose. No active bleeding.    Mouth/Throat:     Mouth: Mucous membranes are moist.     Pharynx: Oropharynx is clear. No oropharyngeal exudate or posterior oropharyngeal erythema.  Eyes:     Conjunctiva/sclera: Conjunctivae normal.  Pulmonary:     Effort: Pulmonary effort is normal.  Musculoskeletal:     Cervical back: Neck  supple.  Skin:    General: Skin is warm and dry.     Findings: No erythema or rash.  Neurological:     Mental Status: She is alert and oriented to person, place, and time.  Psychiatric:        Behavior: Behavior normal.     ED Results / Procedures / Treatments   Labs (all labs ordered are listed, but only abnormal results are displayed) Labs Reviewed - No data to display  EKG None  Radiology No results found.  Procedures .Epistaxis Management  Date/Time: 02/08/2021 4:30 PM Performed by: Tacy Learn, PA-C Authorized by: Tacy Learn, PA-C   Consent:    Consent obtained:  Verbal   Consent given by:  Patient   Risks, benefits, and alternatives were discussed: yes     Risks discussed:  Bleeding, pain, infection and nasal injury   Alternatives discussed:  No treatment Universal protocol:    Patient identity confirmed:  Verbally with patient Anesthesia:    Anesthesia method:  None Procedure details:    Treatment site:  L anterior and R anterior   Repair method: Afrin.   Treatment complexity:  Limited   Treatment episode: initial   Post-procedure details:    Assessment:  Bleeding stopped   Procedure completion:  Tolerated well, no immediate complications     Medications Ordered in ED Medications  oxymetazoline (AFRIN) 0.05 % nasal spray 1 spray (1 spray Each Nare Given 02/08/21 1442)    ED Course  I have reviewed the triage vital signs and the nursing notes.  Pertinent labs & imaging results that were available during my care of the patient were reviewed by me and considered in my medical decision making (see chart for details).  Clinical Course as of 02/08/21 1630  Sat Feb 08, 6021  7559 70 year old female with nosebleed today.  On arrival, no active bleeding.  RN applied Afrin to both sides of the nose at which point patient began having a nosebleed again.  Patient was asked to blow her nose to remove all the clots, Afrin was sprayed to both sides and  pressure was held for 15 minutes with control bleeding.  On recheck after 45 minutes, continues to have bleeding under control.  Patient is advised to follow-up with her ENT, discussed management of nosebleed should return while she is at home today.  Given return to ER precautions. [LM]    Clinical Course User Index [LM] Roque Lias   MDM Rules/Calculators/A&P  Final Clinical Impression(s) / ED Diagnoses Final diagnoses:  Epistaxis    Rx / DC Orders ED Discharge Orders    None       Tacy Learn, PA-C 02/08/21 1630    Elnora Morrison, MD 02/10/21 [phone]

## 2021-02-08 NOTE — ED Notes (Signed)
Afrin used in both nostrils successfully. Patient coughed up 3 larges clots. Bleeding from both nostrils noted with coughing. Pressure applied with gauze again and ice pack applied to bridge of nose. EDPa aware, in room to assess patient.

## 2021-02-10 ENCOUNTER — Telehealth: Payer: Self-pay | Admitting: Cardiology

## 2021-02-10 NOTE — Telephone Encounter (Signed)
Spoke with patient regarding nose bleed Went to ED 4/30 secondary to nose bleed, was advised to hold ASA and follow up with ENT Patient has called ENT twice, no appointment yet. She will call again  Still having what she says is some blood trickling down back of her throat at times.  Advised to follow up with ENT as discussed, remain off ASA and will forward to Dr Stanford Breed for review

## 2021-02-10 NOTE — Telephone Encounter (Signed)
Pt c/o medication issue:  1. Name of Medication: aspirin EC 81 MG tablet  2. How are you currently taking this medication (dosage and times per day)? Currently not taking them  3. Are you having a reaction (difficulty breathing--STAT)? No   4. What is your medication issue? Patient was experiencing nosebleeds and was taken off of aspirin EC 81 MG tablet. Patient wants to know when she can start retaking them again

## 2021-02-10 NOTE — Telephone Encounter (Signed)
Advised patient, verbalized understand   Crenshaw, Denice Bors, MD to Cristopher Estimable, RN      02/10/21 4:20 PM DC ASA and fu with ENT  Kirk Ruths

## 2021-02-12 DIAGNOSIS — R040 Epistaxis: Secondary | ICD-10-CM | POA: Diagnosis not present

## 2021-02-27 DIAGNOSIS — E7800 Pure hypercholesterolemia, unspecified: Secondary | ICD-10-CM | POA: Diagnosis not present

## 2021-02-27 LAB — HEPATIC FUNCTION PANEL
ALT: 9 IU/L (ref 0–32)
AST: 13 IU/L (ref 0–40)
Albumin: 4.3 g/dL (ref 3.8–4.8)
Alkaline Phosphatase: 79 IU/L (ref 44–121)
Bilirubin Total: 0.4 mg/dL (ref 0.0–1.2)
Bilirubin, Direct: 0.11 mg/dL (ref 0.00–0.40)
Total Protein: 6.7 g/dL (ref 6.0–8.5)

## 2021-02-27 LAB — LIPID PANEL
Chol/HDL Ratio: 3.8 ratio (ref 0.0–4.4)
Cholesterol, Total: 210 mg/dL — ABNORMAL HIGH (ref 100–199)
HDL: 56 mg/dL (ref >39)
LDL Chol Calc (NIH): 138 mg/dL — ABNORMAL HIGH (ref 0–99)
Triglycerides: 90 mg/dL (ref 0–149)
VLDL Cholesterol Cal: 16 mg/dL (ref 5–40)

## 2021-03-03 ENCOUNTER — Other Ambulatory Visit: Payer: Self-pay

## 2021-03-03 ENCOUNTER — Telehealth: Payer: Self-pay

## 2021-03-03 ENCOUNTER — Encounter (HOSPITAL_COMMUNITY): Payer: Self-pay | Admitting: Emergency Medicine

## 2021-03-03 ENCOUNTER — Emergency Department (HOSPITAL_COMMUNITY)
Admission: EM | Admit: 2021-03-03 | Discharge: 2021-03-04 | Disposition: A | Discharge status: Home / Self Care | Payer: Medicare Other | Attending: Emergency Medicine | Admitting: Emergency Medicine

## 2021-03-03 DIAGNOSIS — K648 Other hemorrhoids: Secondary | ICD-10-CM | POA: Diagnosis not present

## 2021-03-03 DIAGNOSIS — I2510 Atherosclerotic heart disease of native coronary artery without angina pectoris: Secondary | ICD-10-CM | POA: Diagnosis not present

## 2021-03-03 DIAGNOSIS — Z79899 Other long term (current) drug therapy: Secondary | ICD-10-CM | POA: Diagnosis not present

## 2021-03-03 DIAGNOSIS — K5900 Constipation, unspecified: Secondary | ICD-10-CM

## 2021-03-03 DIAGNOSIS — E039 Hypothyroidism, unspecified: Secondary | ICD-10-CM | POA: Diagnosis not present

## 2021-03-03 DIAGNOSIS — I10 Essential (primary) hypertension: Secondary | ICD-10-CM | POA: Insufficient documentation

## 2021-03-03 LAB — CBC WITH DIFFERENTIAL/PLATELET
Abs Immature Granulocytes: 0.05 10*3/uL (ref 0.00–0.07)
Basophils Absolute: 0.1 10*3/uL (ref 0.0–0.1)
Basophils Relative: 0 %
Eosinophils Absolute: 0 10*3/uL (ref 0.0–0.5)
Eosinophils Relative: 0 %
HCT: 41.7 % (ref 36.0–46.0)
Hemoglobin: 13.6 g/dL (ref 12.0–15.0)
Immature Granulocytes: 0 %
Lymphocytes Relative: 10 %
Lymphs Abs: 1.4 10*3/uL (ref 0.7–4.0)
MCH: 28.8 pg (ref 26.0–34.0)
MCHC: 32.6 g/dL (ref 30.0–36.0)
MCV: 88.3 fL (ref 80.0–100.0)
Monocytes Absolute: 0.6 10*3/uL (ref 0.1–1.0)
Monocytes Relative: 4 %
Neutro Abs: 12.4 10*3/uL — ABNORMAL HIGH (ref 1.7–7.7)
Neutrophils Relative %: 86 %
Platelets: 248 10*3/uL (ref 150–400)
RBC: 4.72 MIL/uL (ref 3.87–5.11)
RDW: 13 % (ref 11.5–15.5)
WBC: 14.5 10*3/uL — ABNORMAL HIGH (ref 4.0–10.5)
nRBC: 0 % (ref 0.0–0.2)

## 2021-03-03 LAB — COMPREHENSIVE METABOLIC PANEL
ALT: 15 U/L (ref 0–44)
AST: 21 U/L (ref 15–41)
Albumin: 4.5 g/dL (ref 3.5–5.0)
Alkaline Phosphatase: 76 U/L (ref 38–126)
Anion gap: 11 (ref 5–15)
BUN: 16 mg/dL (ref 8–23)
CO2: 25 mmol/L (ref 22–32)
Calcium: 9.7 mg/dL (ref 8.9–10.3)
Chloride: 98 mmol/L (ref 98–111)
Creatinine, Ser: 0.78 mg/dL (ref 0.44–1.00)
GFR, Estimated: >60 mL/min (ref >60)
Glucose, Bld: 129 mg/dL — ABNORMAL HIGH (ref 70–99)
Potassium: 3.7 mmol/L (ref 3.5–5.1)
Sodium: 134 mmol/L — ABNORMAL LOW (ref 135–145)
Total Bilirubin: 1.1 mg/dL (ref 0.3–1.2)
Total Protein: 7.8 g/dL (ref 6.5–8.1)

## 2021-03-03 MED ORDER — FLEET ENEMA 7-19 GM/118ML RE ENEM
1.0000 | ENEMA | Freq: Once | RECTAL | Status: AC
  Administered 2021-03-03: 1 via RECTAL

## 2021-03-03 NOTE — ED Triage Notes (Signed)
Pt c/o rectal pressure and last bowel movement Friday.

## 2021-03-03 NOTE — ED Provider Notes (Signed)
Emergency Medicine Provider Triage Evaluation Note  Casey Kramer , a 70 y.o. female  was evaluated in triage.  Pt complains of constipation x3 days.  Nausea, no vomiting.  Decreased appetite.  No fevers.  No previous abdominal surgeries.  Tried suppository and enema without improvement.  Review of Systems  Positive: CN, nausea Negative: fever  Physical Exam  BP (!) 175/92 (BP Location: Right Arm)   Pulse 75   Temp 98.2 F (36.8 C) (Oral)   Resp 17   Ht 5\' 1"  (1.549 m)   Wt 71.2 kg   SpO2 97%   BMI 29.66 kg/m  Gen:   Awake, no distress   Resp:  Normal effort  MSK:   Moves extremities without difficulty  Other:  Mild TTP of lower abdomen.  No rigidity, guarding, distention.  Medical Decision Making  Medically screening exam initiated at 10:10 PM.  Appropriate orders placed.  JENET DURIO was informed that the remainder of the evaluation will be completed by another provider, this initial triage assessment does not replace that evaluation, and the importance of remaining in the ED until their evaluation is complete.  Labs ordered   Franchot Heidelberg, Hershal Coria 03/03/21 2211    Truddie Hidden, MD 03/03/21 [phone]

## 2021-03-03 NOTE — ED Notes (Signed)
Chaperoned rectal exam performed by Dr. Christy Gentles at this time.

## 2021-03-03 NOTE — Telephone Encounter (Signed)
Called and spoke w/pt ad they stated that they would like to hold off on the repatha or praluent. They stated that they would make lifestyle modifications and see what happens. Will route to brian crenshaw and debra mathis

## 2021-03-03 NOTE — Telephone Encounter (Signed)
-----   Message from Harrington Challenger, [place] sent at 02/28/2021  9:32 AM EDT ----- Regarding: Lipid management Looks like this person had an appointment with Melissa in 2021(see note).   Check with patient if she is willing to start PCSK9i and go ahead with prior-authorization if needed.  Thanks Raquel  ----- Message ----- From: Cristopher Estimable, RN Sent: 02/28/2021   9:26 AM EDT To: [facility]  Check and see if pt can afford repatha or praulent; otherwise continue present meds  Kirk Ruths   Can you help me figure out if there is anything we can do to help her afford, she was on in the past but was not able to afford so she had to stop the medication. thanks

## 2021-03-04 NOTE — ED Provider Notes (Signed)
[facility] EMERGENCY DEPARTMENT Provider Note   CSN: [id number] Arrival date & time: 03/03/21  2009     History Chief Complaint  Patient presents with  . Constipation    Casey Kramer is a 70 y.o. female.  The history is provided by the patient.  Constipation Severity:  Moderate Time since last bowel movement:  4 days Timing:  Intermittent Progression:  Worsening Chronicity:  New Relieved by:  Nothing Worsened by:  Nothing Associated symptoms: nausea   Associated symptoms: no abdominal pain, no fever and no vomiting   Patient history of CAD, hypertension, hyperlipidemia presents with constipation.  Patient reports that the past 4 days or so she has had decreased stool output.  She also reports her hemorrhoids are enlarged and painful which is limiting her stool output No fevers or vomiting     Past Medical History:  Diagnosis Date  . CAD (coronary artery disease)   . Hyperlipidemia   . Hypertension   . Hypothyroid     Patient Active Problem List   Diagnosis Date Noted  . Familial hyperlipidemia 08/24/2017    Past Surgical History:  Procedure Laterality Date  . laproscopic endometrial surgrey       OB History   No obstetric history on file.     Family History  Problem Relation Age of Onset  . Heart attack Mother   . Heart disease Mother   . Stroke Mother   . Diabetes Father   . Heart failure Father   . Breast cancer Neg Hx     Social History   Tobacco Use  . Smoking status: Never Smoker  . Smokeless tobacco: Never Used  Vaping Use  . Vaping Use: Never used  Substance Use Topics  . Alcohol use: No  . Drug use: Never    Home Medications Prior to Admission medications   Medication Sig Start Date End Date Taking? Authorizing Provider  carvedilol (COREG) 12.5 MG tablet TAKE 1 TABLET (12.5 MG TOTAL) BY MOUTH 2 (TWO) TIMES DAILY. 05/15/20   Lelon Perla, MD  cholecalciferol (VITAMIN D3) 25 MCG (1000 UNIT) tablet Take 1,000 Units by mouth  daily.    [provider]  ezetimibe (ZETIA) 10 MG tablet Take 1 tablet (10 mg total) by mouth daily. 04/10/20 07/26/20  Lelon Perla, MD  hydrochlorothiazide (MICROZIDE) 12.5 MG capsule TAKE 1 CAPSULE BY MOUTH EVERY DAY 11/06/20   Lelon Perla, MD  levothyroxine (SYNTHROID) 88 MCG tablet Take 88 mcg by mouth daily before breakfast.    [provider]  meclizine (ANTIVERT) 25 MG tablet Take 1 tablet (25 mg total) by mouth 4 (four) times daily as needed for dizziness. 07/26/20   Corena Herter, PA-C  rosuvastatin (CRESTOR) 5 MG tablet Take 1 tablet (5 mg total) by mouth 3 (three) times a week. 04/10/20   Lelon Perla, MD    Allergies    Statins, Amlodipine, Aspirin, and Naproxen  Review of Systems   Review of Systems  Constitutional: Negative for fever.  Gastrointestinal: Positive for constipation and nausea. Negative for abdominal pain and vomiting.  All other systems reviewed and are negative.   Physical Exam Updated Vital Signs BP (!) 168/81 (BP Location: Left Arm)   Pulse 66   Temp 98.6 F (37 C) (Oral)   Resp 16   Ht 1.549 m (5\' 1" )   Wt 71.2 kg   SpO2 98%   BMI 29.66 kg/m   Physical Exam CONSTITUTIONAL: Well developed/well nourished HEAD:  Normocephalic/atraumatic EYES: EOMI/PERRL ENMT: Mucous membranes moist NECK: supple no meningeal signs SPINE/BACK:entire spine nontender CV: S1/S2 noted, no murmurs/rubs/gallops noted LUNGS: Lungs are clear to auscultation bilaterally, no apparent distress ABDOMEN: soft, protuberant but nontender, no rebound or guarding, bowel sounds noted throughout abdomen GU:no cva tenderness Rectal-chaperoned by female chaperone Large external hemorrhoids are noted without evidence of thrombosis.  No rectal mass or abscess.  No bleeding is noted Soft stool is noted in the rectum NEURO: Pt is awake/alert/appropriate, moves all extremitiesx4.  No facial droop.   EXTREMITIES: pulses normal/equal, full ROM SKIN:  warm, color normal PSYCH: no abnormalities of mood noted, alert and oriented to situation  ED Results / Procedures / Treatments   Labs (all labs ordered are listed, but only abnormal results are displayed) Labs Reviewed  CBC WITH DIFFERENTIAL/PLATELET - Abnormal; Notable for the following components:      Result Value   WBC 14.5 (*)    Neutro Abs 12.4 (*)    All other components within normal limits  COMPREHENSIVE METABOLIC PANEL - Abnormal; Notable for the following components:   Sodium 134 (*)    Glucose, Bld 129 (*)    All other components within normal limits    EKG None  Radiology No results found.  Procedures Procedures   Medications Ordered in ED Medications  sodium phosphate (FLEET) 7-19 GM/118ML enema 1 enema (1 enema Rectal Given 03/03/21 2351)    ED Course  I have reviewed the triage vital signs and the nursing notes.  Pertinent labs  results that were available during my care of the patient were reviewed by me and considered in my medical decision making (see chart for details).    MDM Rules/Calculators/A&P                          Patient in no acute distress. She had a large bowel movement after an enema and now feels dramatically improved. She was also able to urinate. She feels safe for discharge home.  Low suspicion for acute abdominal emergency  Final Clinical Impression(s) / ED Diagnoses Final diagnoses:  Constipation, unspecified constipation type    Rx / DC Orders ED Discharge Orders    None       Ripley Fraise, MD 03/04/21 [phone]

## 2021-03-04 NOTE — ED Notes (Signed)
Pt had large bowel movement after enema and she urinated during the bowel movement, edp notified.

## 2021-03-19 ENCOUNTER — Ambulatory Visit
Admission: RE | Admit: 2021-03-19 | Discharge: 2021-03-19 | Disposition: A | Discharge status: Home / Self Care | Payer: Medicare Other | Source: Ambulatory Visit | Attending: Internal Medicine | Admitting: Internal Medicine

## 2021-03-19 ENCOUNTER — Other Ambulatory Visit: Payer: Self-pay

## 2021-03-19 DIAGNOSIS — Z1231 Encounter for screening mammogram for malignant neoplasm of breast: Secondary | ICD-10-CM | POA: Diagnosis not present

## 2021-04-01 ENCOUNTER — Other Ambulatory Visit: Payer: Self-pay | Admitting: Cardiology

## 2021-04-23 DIAGNOSIS — I10 Essential (primary) hypertension: Secondary | ICD-10-CM | POA: Diagnosis not present

## 2021-04-23 DIAGNOSIS — E039 Hypothyroidism, unspecified: Secondary | ICD-10-CM | POA: Diagnosis not present

## 2021-04-23 DIAGNOSIS — E785 Hyperlipidemia, unspecified: Secondary | ICD-10-CM | POA: Diagnosis not present

## 2021-04-30 DIAGNOSIS — Z1339 Encounter for screening examination for other mental health and behavioral disorders: Secondary | ICD-10-CM | POA: Diagnosis not present

## 2021-04-30 DIAGNOSIS — Z1331 Encounter for screening for depression: Secondary | ICD-10-CM | POA: Diagnosis not present

## 2021-04-30 DIAGNOSIS — D329 Benign neoplasm of meninges, unspecified: Secondary | ICD-10-CM | POA: Diagnosis not present

## 2021-04-30 DIAGNOSIS — E785 Hyperlipidemia, unspecified: Secondary | ICD-10-CM | POA: Diagnosis not present

## 2021-04-30 DIAGNOSIS — I10 Essential (primary) hypertension: Secondary | ICD-10-CM | POA: Diagnosis not present

## 2021-04-30 DIAGNOSIS — R82998 Other abnormal findings in urine: Secondary | ICD-10-CM | POA: Diagnosis not present

## 2021-04-30 DIAGNOSIS — Z0000 Encounter for general adult medical examination without abnormal findings: Secondary | ICD-10-CM | POA: Diagnosis not present

## 2021-04-30 DIAGNOSIS — M5416 Radiculopathy, lumbar region: Secondary | ICD-10-CM | POA: Diagnosis not present

## 2021-04-30 DIAGNOSIS — E039 Hypothyroidism, unspecified: Secondary | ICD-10-CM | POA: Diagnosis not present

## 2021-04-30 DIAGNOSIS — Z20822 Contact with and (suspected) exposure to covid-19: Secondary | ICD-10-CM | POA: Diagnosis not present

## 2021-04-30 DIAGNOSIS — Z23 Encounter for immunization: Secondary | ICD-10-CM | POA: Diagnosis not present

## 2021-05-03 ENCOUNTER — Other Ambulatory Visit: Payer: Self-pay | Admitting: Internal Medicine

## 2021-05-03 DIAGNOSIS — M5416 Radiculopathy, lumbar region: Secondary | ICD-10-CM

## 2021-05-17 ENCOUNTER — Ambulatory Visit
Admission: RE | Admit: 2021-05-17 | Discharge: 2021-05-17 | Disposition: A | Discharge status: Home / Self Care | Payer: Medicare Other | Source: Ambulatory Visit | Attending: Internal Medicine | Admitting: Internal Medicine

## 2021-05-17 ENCOUNTER — Other Ambulatory Visit: Payer: Self-pay

## 2021-05-17 DIAGNOSIS — M5416 Radiculopathy, lumbar region: Secondary | ICD-10-CM

## 2021-05-17 DIAGNOSIS — M4727 Other spondylosis with radiculopathy, lumbosacral region: Secondary | ICD-10-CM | POA: Diagnosis not present

## 2021-05-17 DIAGNOSIS — M5116 Intervertebral disc disorders with radiculopathy, lumbar region: Secondary | ICD-10-CM | POA: Diagnosis not present

## 2021-05-17 DIAGNOSIS — M4726 Other spondylosis with radiculopathy, lumbar region: Secondary | ICD-10-CM | POA: Diagnosis not present

## 2021-05-17 DIAGNOSIS — G9519 Other vascular myelopathies: Secondary | ICD-10-CM | POA: Diagnosis not present

## 2021-05-17 MED ORDER — GADOBENATE DIMEGLUMINE 529 MG/ML IV SOLN
17.0000 mL | Freq: Once | INTRAVENOUS | Status: AC | PRN
  Administered 2021-05-17: 17 mL via INTRAVENOUS

## 2021-06-11 DIAGNOSIS — M533 Sacrococcygeal disorders, not elsewhere classified: Secondary | ICD-10-CM | POA: Diagnosis not present

## 2021-06-25 ENCOUNTER — Other Ambulatory Visit (HOSPITAL_COMMUNITY): Payer: Self-pay | Admitting: Neurological Surgery

## 2021-06-25 DIAGNOSIS — D496 Neoplasm of unspecified behavior of brain: Secondary | ICD-10-CM

## 2021-07-09 ENCOUNTER — Other Ambulatory Visit: Payer: Self-pay

## 2021-07-09 ENCOUNTER — Ambulatory Visit (HOSPITAL_COMMUNITY)
Admission: RE | Admit: 2021-07-09 | Discharge: 2021-07-09 | Disposition: A | Discharge status: Home / Self Care | Payer: Medicare Other | Source: Ambulatory Visit | Attending: Neurological Surgery | Admitting: Neurological Surgery

## 2021-07-09 DIAGNOSIS — D496 Neoplasm of unspecified behavior of brain: Secondary | ICD-10-CM | POA: Diagnosis not present

## 2021-07-09 DIAGNOSIS — D329 Benign neoplasm of meninges, unspecified: Secondary | ICD-10-CM | POA: Diagnosis not present

## 2021-07-09 DIAGNOSIS — I639 Cerebral infarction, unspecified: Secondary | ICD-10-CM | POA: Diagnosis not present

## 2021-07-12 ENCOUNTER — Emergency Department (HOSPITAL_COMMUNITY): Payer: Medicare Other

## 2021-07-12 ENCOUNTER — Other Ambulatory Visit: Payer: Self-pay

## 2021-07-12 ENCOUNTER — Emergency Department (HOSPITAL_COMMUNITY)
Admission: EM | Admit: 2021-07-12 | Discharge: 2021-07-12 | Disposition: A | Discharge status: Home / Self Care | Payer: Medicare Other | Attending: Emergency Medicine | Admitting: Emergency Medicine

## 2021-07-12 ENCOUNTER — Encounter (HOSPITAL_COMMUNITY): Payer: Self-pay | Admitting: Emergency Medicine

## 2021-07-12 DIAGNOSIS — M25571 Pain in right ankle and joints of right foot: Secondary | ICD-10-CM | POA: Insufficient documentation

## 2021-07-12 DIAGNOSIS — E039 Hypothyroidism, unspecified: Secondary | ICD-10-CM | POA: Insufficient documentation

## 2021-07-12 DIAGNOSIS — W010XXA Fall on same level from slipping, tripping and stumbling without subsequent striking against object, initial encounter: Secondary | ICD-10-CM | POA: Diagnosis not present

## 2021-07-12 DIAGNOSIS — S8391XA Sprain of unspecified site of right knee, initial encounter: Secondary | ICD-10-CM | POA: Diagnosis not present

## 2021-07-12 DIAGNOSIS — M25561 Pain in right knee: Secondary | ICD-10-CM | POA: Diagnosis not present

## 2021-07-12 DIAGNOSIS — M7989 Other specified soft tissue disorders: Secondary | ICD-10-CM | POA: Diagnosis not present

## 2021-07-12 DIAGNOSIS — S82844A Nondisplaced bimalleolar fracture of right lower leg, initial encounter for closed fracture: Secondary | ICD-10-CM | POA: Insufficient documentation

## 2021-07-12 DIAGNOSIS — Z79899 Other long term (current) drug therapy: Secondary | ICD-10-CM | POA: Insufficient documentation

## 2021-07-12 DIAGNOSIS — I10 Essential (primary) hypertension: Secondary | ICD-10-CM | POA: Insufficient documentation

## 2021-07-12 DIAGNOSIS — I2510 Atherosclerotic heart disease of native coronary artery without angina pectoris: Secondary | ICD-10-CM | POA: Diagnosis not present

## 2021-07-12 DIAGNOSIS — S99911A Unspecified injury of right ankle, initial encounter: Secondary | ICD-10-CM | POA: Diagnosis present

## 2021-07-12 DIAGNOSIS — S82841A Displaced bimalleolar fracture of right lower leg, initial encounter for closed fracture: Secondary | ICD-10-CM

## 2021-07-12 MED ORDER — HYDROCODONE-ACETAMINOPHEN 5-325 MG PO TABS
1.0000 | ORAL_TABLET | Freq: Once | ORAL | Status: AC
  Administered 2021-07-12: 1 via ORAL
  Filled 2021-07-12: qty 1

## 2021-07-12 MED ORDER — HYDROCODONE-ACETAMINOPHEN 5-325 MG PO TABS: ORAL_TABLET | ORAL | 0 refills | Status: DC

## 2021-07-12 NOTE — ED Triage Notes (Signed)
Patient c/o right ankle pain after tripping over some things that were on the floor. Patient states landed on top of right knee and ankle. Denies hitting head or LOC. Per patient some pain also starting to develop in right knee and hip. No rotation or shortening noted.

## 2021-07-12 NOTE — ED Provider Notes (Signed)
[facility] EMERGENCY DEPARTMENT Provider Note   CSN: [id number] Arrival date & time: 07/12/21  1223     History Chief Complaint  Patient presents with   Ankle Pain    ALEXISMARIE FLAIM is a 70 y.o. female.   Ankle Pain Associated symptoms: no back pain, no fatigue, no fever and no neck pain        DYLLAN KATS is a 70 y.o. female who presents to the Emergency Department complaining of right ankle and right knee pain secondary to mechanical fall that occurred earlier today.  She states that she tripped over a leaf blower that was lying in the floor.  She landed on the anterior right knee and states that she "rolled" her ankle.  She was unable to stand without assistance after the fall.  She describes having sharp throbbing pain to her ankle with attempted weightbearing.  She denies head injury or LOC.  She does not take any anticoagulants.  She also complains of some pain to her right hip, but states this was present prior to the fall and she recently had a cortisone injection to her lower back and was told that she likely has some sciatica.  She denies any worsening pain to her hip secondary to the fall.  She also denies any numbness or weakness of her right lower extremity   Past Medical History:  Diagnosis Date   CAD (coronary artery disease)    Hyperlipidemia    Hypertension    Hypothyroid     Patient Active Problem List   Diagnosis Date Noted   Familial hyperlipidemia 08/24/2017    Past Surgical History:  Procedure Laterality Date   laproscopic endometrial surgrey       OB History   No obstetric history on file.     Family History  Problem Relation Age of Onset   Heart attack Mother    Heart disease Mother    Stroke Mother    Diabetes Father    Heart failure Father    Breast cancer Neg Hx     Social History   Tobacco Use   Smoking status: Never   Smokeless tobacco: Never  Vaping Use   Vaping Use: Never used  Substance Use Topics   Alcohol use:  No   Drug use: Never    Home Medications Prior to Admission medications   Medication Sig Start Date End Date Taking? Authorizing Provider  carvedilol (COREG) 12.5 MG tablet TAKE 1 TABLET (12.5 MG TOTAL) BY MOUTH 2 (TWO) TIMES DAILY. 05/15/20   Lelon Perla, MD  cholecalciferol (VITAMIN D3) 25 MCG (1000 UNIT) tablet Take 1,000 Units by mouth daily.    [provider]  ezetimibe (ZETIA) 10 MG tablet Take 1 tablet (10 mg total) by mouth daily. 04/10/20 07/26/20  Lelon Perla, MD  hydrochlorothiazide (MICROZIDE) 12.5 MG capsule TAKE 1 CAPSULE BY MOUTH EVERY DAY 11/06/20   Lelon Perla, MD  levothyroxine (SYNTHROID) 88 MCG tablet Take 88 mcg by mouth daily before breakfast.    [provider]  meclizine (ANTIVERT) 25 MG tablet Take 1 tablet (25 mg total) by mouth 4 (four) times daily as needed for dizziness. 07/26/20   Corena Herter, PA-C  rosuvastatin (CRESTOR) 5 MG tablet TAKE 1 TABLET BY MOUTH 3 (THREE) TIMES A WEEK. 04/01/21   Lelon Perla, MD    Allergies    Statins, Amlodipine, Aspirin, and Naproxen  Review of Systems   Review of Systems  Constitutional:  Negative for chills, fatigue and fever.  Eyes:  Negative for visual disturbance.  Respiratory:  Negative for shortness of breath.   Cardiovascular:  Negative for chest pain.  Gastrointestinal:  Negative for abdominal pain, nausea and vomiting.  Genitourinary:  Negative for difficulty urinating.  Musculoskeletal:  Positive for arthralgias (right knee and ankle pain). Negative for back pain, myalgias, neck pain and neck stiffness.  Skin:  Positive for wound. Negative for color change and rash.       Abrasion right knee   Neurological:  Negative for dizziness, syncope, speech difficulty, weakness, numbness and headaches.  Hematological:  Does not bruise/bleed easily.  Psychiatric/Behavioral:  Negative for confusion.    Physical Exam Updated Vital Signs BP (!) 161/91 (BP Location: Right Arm)    Pulse 66   Temp 98.1 F (36.7 C) (Oral)   Resp 16   Ht 5\' 2"  (1.575 m)   Wt 68.9 kg   SpO2 98%   BMI 27.80 kg/m   Physical Exam Vitals and nursing note reviewed.  Constitutional:      General: She is not in acute distress.    Appearance: Normal appearance. She is not ill-appearing.  HENT:     Head: Atraumatic.     Mouth/Throat:     Mouth: Mucous membranes are moist.  Eyes:     Conjunctiva/sclera: Conjunctivae normal.     Pupils: Pupils are equal, round, and reactive to light.  Cardiovascular:     Rate and Rhythm: Normal rate and regular rhythm.     Pulses: Normal pulses.  Pulmonary:     Effort: Pulmonary effort is normal. No respiratory distress.     Breath sounds: Normal breath sounds.  Abdominal:     General: There is no distension.     Palpations: Abdomen is soft.     Tenderness: There is no abdominal tenderness.  Musculoskeletal:     Cervical back: Full passive range of motion without pain and normal range of motion. No tenderness.     Right knee: No swelling, deformity, effusion or erythema. Normal range of motion. Tenderness present.     Right ankle: Swelling present. No deformity. Tenderness present. Decreased range of motion. Normal pulse.     Comments: Tenderness and edema noted to the lateral right ankle.  Mild ecchymosis noted.  No knee deformity or open wound.  Compartments of the extremity are soft.  Tenderness throughout range of motion of the right knee.  No palpable effusion, crepitus or bony deformity.  Drawer sign negative.    Skin:    General: Skin is warm.     Capillary Refill: Capillary refill takes less than 2 seconds.     Comments: 2 cm superficial appearing abrasion to anterior right knee.  No edema or active bleeding  Neurological:     General: No focal deficit present.     Mental Status: She is alert.     Sensory: No sensory deficit.     Motor: No weakness.    ED Results / Procedures / Treatments   Labs (all labs ordered are listed, but only  abnormal results are displayed) Labs Reviewed - No data to display  EKG None  Radiology DG Ankle Complete Right  Result Date: 07/12/2021 CLINICAL DATA:  Right ankle pain after fall. EXAM: RIGHT ANKLE - COMPLETE 3+ VIEW COMPARISON:  None. FINDINGS: Acute nondisplaced fractures of the distal fibular metaphysis and base of the medial malleolus. The ankle mortise is symmetric. The talar dome is intact. Joint spaces are preserved.  Prominent lateral and mild medial hindfoot soft tissue swelling. IMPRESSION: 1. Acute nondisplaced bimalleolar fractures. Electronically Signed   By: Titus Dubin M.D.   On: 07/12/2021 14:09   DG Knee Complete 4 Views Right  Result Date: 07/12/2021 CLINICAL DATA:  Right knee pain after fall. EXAM: RIGHT KNEE - COMPLETE 4+ VIEW COMPARISON:  None. FINDINGS: No acute fracture or dislocation. No joint effusion. Mild medial and lateral compartment joint space narrowing. Soft tissues are unremarkable. IMPRESSION: 1. No acute osseous abnormality. 2. Mild osteoarthritis. Electronically Signed   By: Titus Dubin M.D.   On: 07/12/2021 14:07    Procedures Procedures   Medications Ordered in ED Medications - No data to display  ED Course  I have reviewed the triage vital signs and the nursing notes.  Pertinent labs & imaging results that were available during my care of the patient were reviewed by me and considered in my medical decision making (see chart for details).   SPLINT APPLICATION Date/Time: 0:81 PM Authorized by: Avyan Livesay Consent: Verbal consent obtained. Risks and benefits: risks, benefits and alternatives were discussed Consent given by: patient Splint applied by: nursing staff Location details: right ankle Splint type: posterior and stirrup Supplies used: orthoglass, padding, ACE wrap Post-procedure: The splinted body part was neurovascularly unchanged following the procedure. Patient tolerance: Patient tolerated the procedure well with no  immediate complications.  Recheck after splint application, extremity neurovascularly intact.  MDM Rules/Calculators/A&P                           Patient here for evaluation of right ankle and knee pain secondary to mechanical fall.  She denies any head injury, neck pain or LOC.  She does not take anticoagulants.  On exam, patient has significant tenderness and edema noted to the lateral right ankle.  There is a small abrasion to the anterior right knee.  No palpable effusion or obvious ligamentous instability of the knee.  Exam of the ankle is concerning for fracture.  Neurovascularly intact.  Compartments are soft.  Review of patient's x-rays, x-ray of the right ankle shows a nondisplaced bimalleolar fracture.  No open wound.  X-ray of the right knee shows mild OA without effusion.  Plan, patient agreeable to splinting and crutches.  She prefers follow-up with her orthopedic provider in [place].  She was also seen by Dr. Karle Starch and care plan discussed.    Final Clinical Impression(s) / ED Diagnoses Final diagnoses:  Bimalleolar fracture of right ankle, closed, initial encounter  Sprain of right knee, unspecified ligament, initial encounter    Rx / DC Orders ED Discharge Orders     None        Bufford Lope 07/12/21 2336    Truddie Hidden, MD 07/13/21 [phone]

## 2021-07-12 NOTE — Discharge Instructions (Addendum)
Your x-ray showed that you have a fracture to both sides of your right ankle.  You have been fitted with a splint.  Use the crutches for weightbearing.  Elevate your right leg when possible.  Call your orthopedic provider on Monday to arrange a follow-up appointment.  You may also contact Dr. Ruthe Mannan office to arrange follow-up if needed.  You may apply ice packs on and off to your knee if needed.  Return to the emergency department for any new or worsening symptoms.

## 2021-07-16 ENCOUNTER — Encounter (HOSPITAL_BASED_OUTPATIENT_CLINIC_OR_DEPARTMENT_OTHER)
Admission: RE | Admit: 2021-07-16 | Discharge: 2021-07-16 | Disposition: A | Discharge status: Home / Self Care | Payer: Medicare Other | Source: Ambulatory Visit | Attending: Orthopedic Surgery | Admitting: Orthopedic Surgery

## 2021-07-16 ENCOUNTER — Encounter (HOSPITAL_BASED_OUTPATIENT_CLINIC_OR_DEPARTMENT_OTHER): Payer: Self-pay | Admitting: Orthopedic Surgery

## 2021-07-16 ENCOUNTER — Other Ambulatory Visit: Payer: Self-pay

## 2021-07-16 DIAGNOSIS — Z01812 Encounter for preprocedural laboratory examination: Secondary | ICD-10-CM | POA: Insufficient documentation

## 2021-07-16 DIAGNOSIS — S82841A Displaced bimalleolar fracture of right lower leg, initial encounter for closed fracture: Secondary | ICD-10-CM | POA: Diagnosis not present

## 2021-07-16 DIAGNOSIS — M25571 Pain in right ankle and joints of right foot: Secondary | ICD-10-CM | POA: Diagnosis not present

## 2021-07-16 LAB — BASIC METABOLIC PANEL
Anion gap: 8 (ref 5–15)
BUN: 12 mg/dL (ref 8–23)
CO2: 28 mmol/L (ref 22–32)
Calcium: 9.5 mg/dL (ref 8.9–10.3)
Chloride: 104 mmol/L (ref 98–111)
Creatinine, Ser: 0.75 mg/dL (ref 0.44–1.00)
GFR, Estimated: >60 mL/min (ref >60)
Glucose, Bld: 90 mg/dL (ref 70–99)
Potassium: 4 mmol/L (ref 3.5–5.1)
Sodium: 140 mmol/L (ref 135–145)

## 2021-07-17 ENCOUNTER — Encounter (HOSPITAL_BASED_OUTPATIENT_CLINIC_OR_DEPARTMENT_OTHER)
Admission: RE | Disposition: A | Discharge status: Home / Self Care | Payer: Self-pay | Source: Home / Self Care | Attending: Orthopedic Surgery

## 2021-07-17 ENCOUNTER — Ambulatory Visit (HOSPITAL_BASED_OUTPATIENT_CLINIC_OR_DEPARTMENT_OTHER)
Admission: RE | Admit: 2021-07-17 | Discharge: 2021-07-17 | Disposition: A | Discharge status: Home / Self Care | Payer: Medicare Other | Attending: Orthopedic Surgery | Admitting: Orthopedic Surgery

## 2021-07-17 ENCOUNTER — Encounter (HOSPITAL_BASED_OUTPATIENT_CLINIC_OR_DEPARTMENT_OTHER): Payer: Self-pay | Admitting: Orthopedic Surgery

## 2021-07-17 ENCOUNTER — Ambulatory Visit (HOSPITAL_BASED_OUTPATIENT_CLINIC_OR_DEPARTMENT_OTHER): Payer: Medicare Other | Admitting: Anesthesiology

## 2021-07-17 ENCOUNTER — Other Ambulatory Visit: Payer: Self-pay

## 2021-07-17 DIAGNOSIS — S82841A Displaced bimalleolar fracture of right lower leg, initial encounter for closed fracture: Secondary | ICD-10-CM | POA: Insufficient documentation

## 2021-07-17 DIAGNOSIS — I2510 Atherosclerotic heart disease of native coronary artery without angina pectoris: Secondary | ICD-10-CM | POA: Diagnosis not present

## 2021-07-17 DIAGNOSIS — Z886 Allergy status to analgesic agent status: Secondary | ICD-10-CM | POA: Insufficient documentation

## 2021-07-17 DIAGNOSIS — E785 Hyperlipidemia, unspecified: Secondary | ICD-10-CM | POA: Insufficient documentation

## 2021-07-17 DIAGNOSIS — E039 Hypothyroidism, unspecified: Secondary | ICD-10-CM | POA: Diagnosis not present

## 2021-07-17 DIAGNOSIS — G8918 Other acute postprocedural pain: Secondary | ICD-10-CM | POA: Diagnosis not present

## 2021-07-17 DIAGNOSIS — I10 Essential (primary) hypertension: Secondary | ICD-10-CM | POA: Insufficient documentation

## 2021-07-17 DIAGNOSIS — Z79899 Other long term (current) drug therapy: Secondary | ICD-10-CM | POA: Insufficient documentation

## 2021-07-17 DIAGNOSIS — Z79890 Hormone replacement therapy (postmenopausal): Secondary | ICD-10-CM | POA: Diagnosis not present

## 2021-07-17 DIAGNOSIS — W010XXA Fall on same level from slipping, tripping and stumbling without subsequent striking against object, initial encounter: Secondary | ICD-10-CM | POA: Insufficient documentation

## 2021-07-17 DIAGNOSIS — Z888 Allergy status to other drugs, medicaments and biological substances status: Secondary | ICD-10-CM | POA: Diagnosis not present

## 2021-07-17 HISTORY — PX: ORIF ANKLE FRACTURE: SHX5408

## 2021-07-17 SURGERY — OPEN REDUCTION INTERNAL FIXATION (ORIF) ANKLE FRACTURE
Anesthesia: General | Site: Ankle | Laterality: Right

## 2021-07-17 MED ORDER — FENTANYL CITRATE (PF) 100 MCG/2ML IJ SOLN
INTRAMUSCULAR | Status: AC
  Filled 2021-07-17: qty 2

## 2021-07-17 MED ORDER — CLONIDINE HCL (ANALGESIA) 100 MCG/ML EP SOLN
EPIDURAL | Status: DC | PRN
  Administered 2021-07-17: 50 ug

## 2021-07-17 MED ORDER — DEXAMETHASONE SODIUM PHOSPHATE 10 MG/ML IJ SOLN
INTRAMUSCULAR | Status: DC | PRN
  Administered 2021-07-17: 4 mg via INTRAVENOUS

## 2021-07-17 MED ORDER — ONDANSETRON HCL 4 MG/2ML IJ SOLN
INTRAMUSCULAR | Status: DC | PRN
  Administered 2021-07-17: 4 mg via INTRAVENOUS

## 2021-07-17 MED ORDER — CEFAZOLIN SODIUM-DEXTROSE 2-4 GM/100ML-% IV SOLN
2.0000 g | INTRAVENOUS | Status: AC
  Administered 2021-07-17: 2 g via INTRAVENOUS

## 2021-07-17 MED ORDER — MIDAZOLAM HCL 2 MG/2ML IJ SOLN
2.0000 mg | Freq: Once | INTRAMUSCULAR | Status: AC
  Administered 2021-07-17: 1 mg via INTRAVENOUS

## 2021-07-17 MED ORDER — LACTATED RINGERS IV SOLN: INTRAVENOUS | Status: DC

## 2021-07-17 MED ORDER — VANCOMYCIN HCL 500 MG IV SOLR
INTRAVENOUS | Status: DC | PRN
  Administered 2021-07-17: 500 mg

## 2021-07-17 MED ORDER — ACETAMINOPHEN 500 MG PO TABS
ORAL_TABLET | ORAL | Status: AC
  Filled 2021-07-17: qty 2

## 2021-07-17 MED ORDER — MIDAZOLAM HCL 2 MG/2ML IJ SOLN
INTRAMUSCULAR | Status: AC
  Filled 2021-07-17: qty 2

## 2021-07-17 MED ORDER — OXYCODONE HCL 5 MG PO TABS: 5.0000 mg | ORAL_TABLET | Freq: Four times a day (QID) | ORAL | 0 refills | Status: AC | PRN

## 2021-07-17 MED ORDER — FENTANYL CITRATE (PF) 100 MCG/2ML IJ SOLN: 25.0000 ug | INTRAMUSCULAR | Status: DC | PRN

## 2021-07-17 MED ORDER — EPHEDRINE SULFATE 50 MG/ML IJ SOLN
INTRAMUSCULAR | Status: DC | PRN
  Administered 2021-07-17 (×2): 10 mg via INTRAVENOUS

## 2021-07-17 MED ORDER — SODIUM CHLORIDE 0.9 % IV SOLN: INTRAVENOUS | Status: DC

## 2021-07-17 MED ORDER — FENTANYL CITRATE (PF) 100 MCG/2ML IJ SOLN
100.0000 ug | Freq: Once | INTRAMUSCULAR | Status: AC
  Administered 2021-07-17: 50 ug via INTRAVENOUS

## 2021-07-17 MED ORDER — CEFAZOLIN SODIUM-DEXTROSE 2-4 GM/100ML-% IV SOLN
INTRAVENOUS | Status: AC
  Filled 2021-07-17: qty 100

## 2021-07-17 MED ORDER — LIDOCAINE HCL (CARDIAC) PF 100 MG/5ML IV SOSY
PREFILLED_SYRINGE | INTRAVENOUS | Status: DC | PRN
  Administered 2021-07-17: 60 mg via INTRAVENOUS

## 2021-07-17 MED ORDER — PROPOFOL 10 MG/ML IV BOLUS
INTRAVENOUS | Status: DC | PRN
  Administered 2021-07-17: 160 mg via INTRAVENOUS
  Administered 2021-07-17: 40 mg via INTRAVENOUS

## 2021-07-17 MED ORDER — ROPIVACAINE HCL 5 MG/ML IJ SOLN
INTRAMUSCULAR | Status: DC | PRN
  Administered 2021-07-17: 10 mL via PERINEURAL
  Administered 2021-07-17: 30 mL via PERINEURAL

## 2021-07-17 MED ORDER — ONDANSETRON HCL 4 MG/2ML IJ SOLN: 4.0000 mg | Freq: Once | INTRAMUSCULAR | Status: DC | PRN

## 2021-07-17 MED ORDER — 0.9 % SODIUM CHLORIDE (POUR BTL) OPTIME
TOPICAL | Status: DC | PRN
  Administered 2021-07-17: 500 mL

## 2021-07-17 MED ORDER — PHENYLEPHRINE HCL (PRESSORS) 10 MG/ML IV SOLN
INTRAVENOUS | Status: DC | PRN
  Administered 2021-07-17: 80 ug via INTRAVENOUS

## 2021-07-17 MED ORDER — ACETAMINOPHEN 500 MG PO TABS
1000.0000 mg | ORAL_TABLET | Freq: Once | ORAL | Status: AC
  Administered 2021-07-17: 500 mg via ORAL

## 2021-07-17 SURGICAL SUPPLY — 75 items
APL PRP STRL LF DISP 70% ISPRP (MISCELLANEOUS)
BANDAGE ESMARK 6X9 LF (GAUZE/BANDAGES/DRESSINGS) IMPLANT
BIT DRILL 2.5X2.75 QC CALB (BIT) ×3 IMPLANT
BIT DRILL 3.5X5.5 QC CALB (BIT) ×3 IMPLANT
BLADE SURG 15 STRL LF DISP TIS (BLADE) ×2 IMPLANT
BLADE SURG 15 STRL SS (BLADE) ×6
BNDG CMPR 9X4 STRL LF SNTH (GAUZE/BANDAGES/DRESSINGS)
BNDG CMPR 9X6 STRL LF SNTH (GAUZE/BANDAGES/DRESSINGS)
BNDG COHESIVE 3X5 TAN ST LF (GAUZE/BANDAGES/DRESSINGS) IMPLANT
BNDG COHESIVE 4X5 TAN ST LF (GAUZE/BANDAGES/DRESSINGS) IMPLANT
BNDG COHESIVE 6X5 TAN ST LF (GAUZE/BANDAGES/DRESSINGS) IMPLANT
BNDG ELASTIC 4X5.8 VLCR STR LF (GAUZE/BANDAGES/DRESSINGS) IMPLANT
BNDG ESMARK 4X9 LF (GAUZE/BANDAGES/DRESSINGS) IMPLANT
BNDG ESMARK 6X9 LF (GAUZE/BANDAGES/DRESSINGS)
BNDG GAUZE ELAST 4 BULKY (GAUZE/BANDAGES/DRESSINGS) IMPLANT
CANISTER SUCT 1200ML W/VALVE (MISCELLANEOUS) ×3 IMPLANT
CHLORAPREP W/TINT 26 (MISCELLANEOUS) IMPLANT
COVER BACK TABLE 60X90IN (DRAPES) ×3 IMPLANT
CUFF TOURN SGL QUICK 24 (TOURNIQUET CUFF)
CUFF TOURN SGL QUICK 34 (TOURNIQUET CUFF)
CUFF TRNQT CYL 24X4X16.5-23 (TOURNIQUET CUFF) IMPLANT
CUFF TRNQT CYL 34X4.125X (TOURNIQUET CUFF) IMPLANT
DRAPE EXTREMITY T 121X128X90 (DISPOSABLE) ×3 IMPLANT
DRAPE INCISE IOBAN 66X45 STRL (DRAPES) IMPLANT
DRAPE SURG 17X23 STRL (DRAPES) IMPLANT
DRAPE U-SHAPE 47X51 STRL (DRAPES) ×3 IMPLANT
DRSG MEPITEL 4X7.2 (GAUZE/BANDAGES/DRESSINGS) ×3 IMPLANT
DRSG PAD ABDOMINAL 8X10 ST (GAUZE/BANDAGES/DRESSINGS) ×3 IMPLANT
ELECT REM PT RETURN 9FT ADLT (ELECTROSURGICAL) ×3
ELECTRODE REM PT RTRN 9FT ADLT (ELECTROSURGICAL) ×1 IMPLANT
GLOVE SRG 8 PF TXTR STRL LF DI (GLOVE) ×2 IMPLANT
GLOVE SURG ENC MOIS LTX SZ8 (GLOVE) ×3 IMPLANT
GLOVE SURG LTX SZ8 (GLOVE) ×3 IMPLANT
GLOVE SURG UNDER POLY LF SZ8 (GLOVE) ×6
GOWN STRL REUS W/ TWL LRG LVL3 (GOWN DISPOSABLE) ×1 IMPLANT
GOWN STRL REUS W/ TWL XL LVL3 (GOWN DISPOSABLE) ×2 IMPLANT
GOWN STRL REUS W/TWL LRG LVL3 (GOWN DISPOSABLE) ×3
GOWN STRL REUS W/TWL XL LVL3 (GOWN DISPOSABLE) ×6
K-WIRE ACE 1.6X6 (WIRE) ×6
KWIRE ACE 1.6X6 (WIRE) ×2 IMPLANT
MANIFOLD NEPTUNE II (INSTRUMENTS) ×3 IMPLANT
NEEDLE HYPO 22GX1.5 SAFETY (NEEDLE) IMPLANT
PACK BASIN DAY SURGERY FS (CUSTOM PROCEDURE TRAY) ×3 IMPLANT
PAD CAST 4YDX4 CTTN HI CHSV (CAST SUPPLIES) ×2 IMPLANT
PADDING CAST COTTON 4X4 STRL (CAST SUPPLIES) ×6
PENCIL SMOKE EVACUATOR (MISCELLANEOUS) ×3 IMPLANT
PLATE ACE 100DEG 6HOLE (Plate) ×3 IMPLANT
SANITIZER HAND PURELL 535ML FO (MISCELLANEOUS) IMPLANT
SCREW ACE CAN 4.0 40M (Screw) ×6 IMPLANT
SCREW CORTICAL 3.5MM  16MM (Screw) ×9 IMPLANT
SCREW CORTICAL 3.5MM  20MM (Screw) ×3 IMPLANT
SCREW CORTICAL 3.5MM 16MM (Screw) ×3 IMPLANT
SCREW CORTICAL 3.5MM 18MM (Screw) ×9 IMPLANT
SCREW CORTICAL 3.5MM 20MM (Screw) ×1 IMPLANT
SCREW CORTICAL 3.5MM 24MM (Screw) ×3 IMPLANT
SET IRRIG Y TYPE TUR BLADDER L (SET/KITS/TRAYS/PACK) ×3 IMPLANT
SHEET MEDIUM DRAPE 40X70 STRL (DRAPES) ×3 IMPLANT
SPLINT FAST PLASTER 5X30 (CAST SUPPLIES)
SPLINT PLASTER CAST FAST 5X30 (CAST SUPPLIES) IMPLANT
SPONGE T-LAP 18X18 ~~LOC~~+RFID (SPONGE) ×3 IMPLANT
STOCKINETTE 6  STRL (DRAPES) ×3
STOCKINETTE 6 STRL (DRAPES) ×1 IMPLANT
SUCTION FRAZIER HANDLE 10FR (MISCELLANEOUS)
SUCTION TUBE FRAZIER 10FR DISP (MISCELLANEOUS) IMPLANT
SUT ETHILON 3 0 PS 1 (SUTURE) ×3 IMPLANT
SUT VIC AB 2-0 SH 27 (SUTURE)
SUT VIC AB 2-0 SH 27XBRD (SUTURE) IMPLANT
SYR BULB EAR ULCER 3OZ GRN STR (SYRINGE) ×3 IMPLANT
SYR CONTROL 10ML LL (SYRINGE) IMPLANT
TOWEL GREEN STERILE FF (TOWEL DISPOSABLE) ×3 IMPLANT
TRAY DSU PREP LF (CUSTOM PROCEDURE TRAY) IMPLANT
TUBE CONNECTING 20'X1/4 (TUBING) ×1
TUBE CONNECTING 20X1/4 (TUBING) ×2 IMPLANT
UNDERPAD 30X36 HEAVY ABSORB (UNDERPADS AND DIAPERS) ×3 IMPLANT
YANKAUER SUCT BULB TIP NO VENT (SUCTIONS) ×3 IMPLANT

## 2021-07-17 NOTE — Progress Notes (Signed)
Assisted Dr. Gifford Shave with right, ultrasound guided, popliteal, adductor canal block. Side rails up, monitors on throughout procedure. See vital signs in flow sheet. Tolerated Procedure well.

## 2021-07-17 NOTE — Anesthesia Procedure Notes (Signed)
Anesthesia Regional Block: Popliteal block   Pre-Anesthetic Checklist: , timeout performed,  Correct Patient, Correct Site, Correct Laterality,  Correct Procedure, Correct Position, site marked,  Risks and benefits discussed,  Surgical consent,  Pre-op evaluation,  At surgeon's request and post-op pain management  Laterality: Right  Prep: chloraprep       Needles:  Injection technique: Single-shot  Needle Type: Echogenic Needle     Needle Length: 9cm  Needle Gauge: 21     Additional Needles:   Procedures:,,,, ultrasound used (permanent image in chart),,    Narrative:  Start time: 07/17/2021 1:44 PM End time: 07/17/2021 1:50 PM Injection made incrementally with aspirations every 5 mL.  Performed by: Personally  Anesthesiologist: Catalina Gravel, MD  Additional Notes: No pain on injection. No increased resistance to injection. Injection made in 5cc increments.  Good needle visualization.  Patient tolerated procedure well.

## 2021-07-17 NOTE — Op Note (Signed)
07/17/2021  3:39 PM  PATIENT:  Casey Kramer  70 y.o. female  PRE-OPERATIVE DIAGNOSIS:  right ankle bimalleolar fracture  POST-OPERATIVE DIAGNOSIS:  right ankle bimalleolar fracture  Procedure(s):  1.  Open treatment right ankle bimalleolar fracture with internal fixation 2.  Stress examination of the right ankle under fluoroscopy 3.  AP, mortise and lateral radiographs of the right ankle  SURGEON:  Wylene Simmer, MD  ASSISTANT: None  ANESTHESIA:   General, regional  EBL:  minimal   TOURNIQUET:   Total Tourniquet Time Documented: Thigh (Right) - 37 minutes Total: Thigh (Right) - 37 minutes  COMPLICATIONS:  None apparent  DISPOSITION:  Extubated, awake and stable to recovery.  INDICATION FOR PROCEDURE: The patient is a 70 year old female without significant past medical history.  She injured her right ankle when she tripped at home and fell.  Radiographs reveal a bimalleolar fracture that is displaced.  She presents now for operative treatment of this displaced and unstable right ankle injury.  The risks and benefits of the alternative treatment options have been discussed in detail.  The patient wishes to proceed with surgery and specifically understands risks of bleeding, infection, nerve damage, blood clots, need for additional surgery, amputation and death.    PROCEDURE IN DETAIL:  After pre operative consent was obtained, and the correct operative site was identified, the patient was brought to the operating room and placed supine on the OR table.  Anesthesia was administered.  Pre-operative antibiotics were administered.  A surgical timeout was taken.  The right lower extremity was prepped and draped in standard sterile fashion with a tourniquet around the thigh.  The extremity was exsanguinated and the thigh tourniquet inflated to 250 mmHg.  A longitudinal incision was made over the lateral malleolus.  Dissection was carried sharply down through the subcutaneous tissues.  The  fracture was cleaned of all hematoma and irrigated.  It was reduced and held with a lobster claw clamp.  A lag screw was inserted from posterior to anterior and was noted to have excellent purchase.  A 6 hole one third tubular plate from the [employer] titanium small frag set was selected.  It was contoured to fit the lateral malleolus.  It was secured distally with 3 unicortical screws and proximally with 3 bicortical screws.  Attention was turned to the medial side of the ankle.  An incision was made and dissection carried down through the subcutaneous tissues.  The tibial periosteum was incised.  The fracture site was identified.  It was cleaned of all hematoma.  The fracture was reduced and held provisionally with a tenaculum.  Radiographs confirmed appropriate reduction of the fracture.  2 guidewires were then inserted across the fracture line.  4 mm x 40 mm partially-threaded cannulated screws were inserted and were both noted to have excellent purchase.  Both K wires were removed.  AP, mortise and lateral radiographs were obtained.  These show appropriate reduction of both medial and lateral malleolus fractures.  Hardware is appropriately positioned and of the appropriate lengths.  A stress examination was then performed.  Dorsiflexion and external rotation stress was applied to the supinated forefoot.  A mortise view was obtained and showed no widening of the ankle mortise.  Both wounds were irrigated copiously and sprinkled with vancomycin powder.  Subcutaneous tissues were approximated with 3-0 Monocryl.  Skin incisions were closed with 3-0 nylon.  Sterile dressings were applied followed by a well-padded short leg splint.  The tourniquet was released after application  of the dressings.  The patient was awakened from anesthesia and transported to the recovery room in stable condition.   FOLLOW UP PLAN: Nonweightbearing on the right lower extremity.  Follow-up in the office in 2 weeks for suture  removal and conversion to a short leg cast.  Plan 6 weeks postoperative immobilization.  Aspirin for DVT prophylaxis.   RADIOGRAPHS: AP, mortise and lateral radiographs of the right ankle are obtained intraoperatively.  These show interval reduction and fixation of the bimalleolar ankle fracture.  Hardware is appropriately positioned and of the appropriate lengths.  No other acute injuries are noted.

## 2021-07-17 NOTE — Transfer of Care (Signed)
Immediate Anesthesia Transfer of Care Note  Patient: Casey Kramer  Procedure(s) Performed: OPEN REDUCTION INTERNAL FIXATION (ORIF) right ankle bimalleolar fracture (Right: Ankle)  Patient Location: PACU  Anesthesia Type:General and Regional  Level of Consciousness: drowsy  Airway & Oxygen Therapy: Patient Spontanous Breathing and Patient connected to face mask oxygen  Post-op Assessment: Report given to RN and Post -op Vital signs reviewed and stable  Post vital signs: Reviewed and stable  Last Vitals:  Vitals Value Taken Time  BP    Temp    Pulse 70 07/17/21 1539  Resp 16 07/17/21 1539  SpO2 100 % 07/17/21 1539  Vitals shown include unvalidated device data.  Last Pain:  Vitals:   07/17/21 1251  TempSrc: Oral  PainSc: 6       Patients Stated Pain Goal: 6 (30/10/40 4591)  Complications: No notable events documented.

## 2021-07-17 NOTE — Discharge Instructions (Addendum)
Wylene Simmer, MD [facility]  Please read the following information regarding your care after surgery.  Medications  You only need a prescription for the narcotic pain medicine (ex. oxycodone, Percocet, Norco).  All of the other medicines listed below are available over the counter. X Aleve 2 pills twice a day for the first 3 days after surgery. X acetominophen (Tylenol) 650 mg every 4-6 hours as you need for minor to moderate pain X oxycodone as prescribed for severe pain  Narcotic pain medicine (ex. oxycodone, Percocet, Vicodin) will cause constipation.  To prevent this problem, take the following medicines while you are taking any pain medicine. X docusate sodium (Colace) 100 mg twice a day X senna (Senokot) 2 tablets twice a day  X To help prevent blood clots, take a baby aspirin (81 mg) twice a day for two weeks after surgery.  You should also get up every hour while you are awake to move around.    Weight Bearing ? Bear weight when you are able on your operated leg or foot. ? Bear weight only on your operated foot in the post-op shoe. X Do not bear any weight on the operated leg or foot.  Cast / Splint / Dressing X Keep your splint, cast or dressing clean and dry.  Don't put anything (coat hanger, pencil, etc) down inside of it.  If it gets damp, use a hair dryer on the cool setting to dry it.  If it gets soaked, call the office to schedule an appointment for a cast change. ? Remove your dressing 3 days after surgery and cover the incisions with dry dressings.    After your dressing, cast or splint is removed; you may shower, but do not soak or scrub the wound.  Allow the water to run over it, and then gently pat it dry.  Swelling It is normal for you to have swelling where you had surgery.  To reduce swelling and pain, keep your toes above your nose for at least 3 days after surgery.  It may be necessary to keep your foot or leg elevated for several weeks.  If it hurts, it should be  elevated.  Follow Up Call my office at [phone] when you are discharged from the hospital or surgery center to schedule an appointment to be seen two weeks after surgery.  Call my office at [phone] if you develop a fever >101.5 F, nausea, vomiting, bleeding from the surgical site or severe pain.     Next dose of Tylenol after 7pm as needed for pain.   Post Anesthesia Home Care Instructions  Activity: Get plenty of rest for the remainder of the day. A responsible individual must stay with you for 24 hours following the procedure.  For the next 24 hours, DO NOT: -Drive a car -Paediatric nurse -Drink alcoholic beverages -Take any medication unless instructed by your physician -Make any legal decisions or sign important papers.  Meals: Start with liquid foods such as gelatin or soup. Progress to regular foods as tolerated. Avoid greasy, spicy, heavy foods. If nausea and/or vomiting occur, drink only clear liquids until the nausea and/or vomiting subsides. Call your physician if vomiting continues.  Special Instructions/Symptoms: Your throat may feel dry or sore from the anesthesia or the breathing tube placed in your throat during surgery. If this causes discomfort, gargle with warm salt water. The discomfort should disappear within 24 hours.  If you had a scopolamine patch placed behind your ear for the management of post-  operative nausea and/or vomiting:  1. The medication in the patch is effective for 72 hours, after which it should be removed.  Wrap patch in a tissue and discard in the trash. Wash hands thoroughly with soap and water. 2. You may remove the patch earlier than 72 hours if you experience unpleasant side effects which may include dry mouth, dizziness or visual disturbances. 3. Avoid touching the patch. Wash your hands with soap and water after contact with the patch.      Regional Anesthesia Blocks  1. Numbness or the inability to move the "blocked"  extremity may last from 3-48 hours after placement. The length of time depends on the medication injected and your individual response to the medication. If the numbness is not going away after 48 hours, call your surgeon.  2. The extremity that is blocked will need to be protected until the numbness is gone and the  Strength has returned. Because you cannot feel it, you will need to take extra care to avoid injury. Because it may be weak, you may have difficulty moving it or using it. You may not know what position it is in without looking at it while the block is in effect.  3. For blocks in the legs and feet, returning to weight bearing and walking needs to be done carefully. You will need to wait until the numbness is entirely gone and the strength has returned. You should be able to move your leg and foot normally before you try and bear weight or walk. You will need someone to be with you when you first try to ensure you do not fall and possibly risk injury.  4. Bruising and tenderness at the needle site are common side effects and will resolve in a few days.  5. Persistent numbness or new problems with movement should be communicated to the surgeon or the [facility] [phone] [place] [phone]).

## 2021-07-17 NOTE — H&P (Signed)
Casey Kramer is an 70 y.o. female.   Chief Complaint: Right ankle pain HPI: 70 year old female without significant past medical history complains of pain in the right ankle since she tripped and fell a few days ago.  Radiographs in the office revealed a displaced bimalleolar fracture.  She presents now for operative treatment of this displaced and unstable right ankle injury.  Past Medical History:  Diagnosis Date   CAD (coronary artery disease)    Hyperlipidemia    Hypertension    Hypothyroid     Past Surgical History:  Procedure Laterality Date   laproscopic endometrial surgrey      Family History  Problem Relation Age of Onset   Heart attack Mother    Heart disease Mother    Stroke Mother    Diabetes Father    Heart failure Father    Breast cancer Neg Hx    Social History:  reports that she has never smoked. She has never used smokeless tobacco. She reports that she does not drink alcohol and does not use drugs.  Allergies:  Allergies  Allergen Reactions   Statins Other (See Comments)    Sensitivity to pain/aching joints   Amlodipine Other (See Comments)    Reaction is unknown   Aspirin     Can tolerate 81mg  daily, higher doses give indigestion    Naproxen Swelling and Other (See Comments)    Lips swell.    Medications Prior to Admission  Medication Sig Dispense Refill   carvedilol (COREG) 12.5 MG tablet TAKE 1 TABLET (12.5 MG TOTAL) BY MOUTH 2 (TWO) TIMES DAILY. 180 tablet 3   hydrochlorothiazide (MICROZIDE) 12.5 MG capsule TAKE 1 CAPSULE BY MOUTH EVERY DAY 90 capsule 2   HYDROcodone-acetaminophen (NORCO/VICODIN) 5-325 MG tablet Take one tab po q 4 hrs prn pain 12 tablet 0   levothyroxine (SYNTHROID) 88 MCG tablet Take 88 mcg by mouth daily before breakfast.     rosuvastatin (CRESTOR) 5 MG tablet TAKE 1 TABLET BY MOUTH 3 (THREE) TIMES A WEEK. 36 tablet 3   ezetimibe (ZETIA) 10 MG tablet Take 1 tablet (10 mg total) by mouth daily. 90 tablet 3   meclizine  (ANTIVERT) 25 MG tablet Take 1 tablet (25 mg total) by mouth 4 (four) times daily as needed for dizziness. 30 tablet 0    Results for orders placed or performed during the hospital encounter of 07/17/21 (from the past 48 hour(s))  Basic metabolic panel per protocol     Status: None   Collection Time: 07/16/21  2:30 PM  Result Value Ref Range   Sodium 140 135 - 145 mmol/L   Potassium 4.0 3.5 - 5.1 mmol/L   Chloride 104 98 - 111 mmol/L   CO2 28 22 - 32 mmol/L   Glucose, Bld 90 70 - 99 mg/dL    Comment: Glucose reference range applies only to samples taken after fasting for at least 8 hours.   BUN 12 8 - 23 mg/dL   Creatinine, Ser 0.75 0.44 - 1.00 mg/dL   Calcium 9.5 8.9 - 10.3 mg/dL   GFR, Estimated >60 >60 mL/min    Comment: (NOTE) Calculated using the CKD-EPI Creatinine Equation (2021)    Anion gap 8 5 - 15    Comment: Performed at [facility] [address], [place], [place] [address]   No results found.  Review of Systems no recent fever, chills, nausea, vomiting or changes in her appetite  Blood pressure 140/68, pulse 65, temperature 99.1  F (37.3 C), temperature source Oral, resp. rate 13, height 5\' 2"  (1.575 m), weight 70.9 kg, SpO2 98 %. Physical Exam  Well-nourished well-developed woman in no apparent distress.  Alert and oriented.  Normal mood and affect.  Gait is nonweightbearing on the right.  The right ankle has swelling and intact skin.  Pulses are palpable in the foot.  Intact sensibility to light touch dorsally and plantarly at the forefoot.  5 out of 5 strength in plantarflexion and dorsiflexion of the toes.  X-rays: 3 views nonweightbearing of the right ankle are reviewed.  These show a Weber B lateral malleolus fracture with shortening and posterior displacement.  Medial malleolus is distracted with the ankle in slight valgus.   Assessment/Plan Right ankle bimalleolar fracture -to the operating room today for open treatment with internal fixation.  The  risks and benefits of the alternative treatment options have been discussed in detail.  The patient wishes to proceed with surgery and specifically understands risks of bleeding, infection, nerve damage, blood clots, need for additional surgery, amputation and death.   Wylene Simmer, MD 13-Aug-2021, 2:17 PM

## 2021-07-17 NOTE — Anesthesia Postprocedure Evaluation (Signed)
Anesthesia Post Note  Patient: Casey Kramer  Procedure(s) Performed: OPEN REDUCTION INTERNAL FIXATION (ORIF) right ankle bimalleolar fracture (Right: Ankle)     Patient location during evaluation: PACU Anesthesia Type: General Level of consciousness: awake and alert Pain management: pain level controlled Vital Signs Assessment: post-procedure vital signs reviewed and stable Respiratory status: spontaneous breathing, nonlabored ventilation, respiratory function stable and patient connected to nasal cannula oxygen Cardiovascular status: blood pressure returned to baseline and stable Postop Assessment: no apparent nausea or vomiting Anesthetic complications: no   No notable events documented.  Last Vitals:  Vitals:   07/17/21 1600 07/17/21 1610  BP: 137/82 139/81  Pulse: 82 77  Resp: 16 16  Temp:  37 C  SpO2: 97% 97%    Last Pain:  Vitals:   07/17/21 1610  TempSrc:   PainSc: 0-No pain                 Catalina Gravel

## 2021-07-17 NOTE — Anesthesia Procedure Notes (Signed)
Procedure Name: LMA Insertion Date/Time: 07/17/2021 2:38 PM Performed by: Eulas Post, April W, CRNA Pre-anesthesia Checklist: Patient identified, Emergency Drugs available, Suction available and Patient being monitored Patient Re-evaluated:Patient Re-evaluated prior to induction Oxygen Delivery Method: Circle System Utilized Preoxygenation: Pre-oxygenation with 100% oxygen Induction Type: IV induction Ventilation: Mask ventilation without difficulty LMA: LMA inserted LMA Size: 4.0 Number of attempts: 1 Airway Equipment and Method: Bite block Placement Confirmation: positive ETCO2 Tube secured with: Tape Dental Injury: Teeth and Oropharynx as per pre-operative assessment

## 2021-07-17 NOTE — Anesthesia Preprocedure Evaluation (Signed)
Anesthesia Evaluation  Patient identified by MRN, date of birth, ID band Patient awake    Reviewed: Allergy & Precautions, NPO status , Patient's Chart, lab work & pertinent test results, reviewed documented beta blocker date and time   Airway Mallampati: II  TM Distance: >3 FB Neck ROM: Full    Dental  (+) Teeth Intact, Dental Advisory Given   Pulmonary neg pulmonary ROS,    Pulmonary exam normal breath sounds clear to auscultation       Cardiovascular hypertension, Pt. on home beta blockers and Pt. on medications + CAD  Normal cardiovascular exam Rhythm:Regular Rate:Normal     Neuro/Psych negative neurological ROS  negative psych ROS   GI/Hepatic negative GI ROS, Neg liver ROS,   Endo/Other  Hypothyroidism   Renal/GU negative Renal ROS     Musculoskeletal right ankle bimalleolar fracture   Abdominal   Peds  Hematology negative hematology ROS (+)   Anesthesia Other Findings Day of surgery medications reviewed with the patient.  Reproductive/Obstetrics                             Anesthesia Physical Anesthesia Plan  ASA: 3  Anesthesia Plan: General   Post-op Pain Management:  Regional for Post-op pain   Induction: Intravenous  PONV Risk Score and Plan: 3 and Dexamethasone and Ondansetron  Airway Management Planned: LMA  Additional Equipment:   Intra-op Plan:   Post-operative Plan: Extubation in OR  Informed Consent: I have reviewed the patients History and Physical, chart, labs and discussed the procedure including the risks, benefits and alternatives for the proposed anesthesia with the patient or authorized representative who has indicated his/her understanding and acceptance.     Dental advisory given  Plan Discussed with: CRNA  Anesthesia Plan Comments:         Anesthesia Quick Evaluation

## 2021-07-17 NOTE — Anesthesia Procedure Notes (Signed)
Anesthesia Regional Block: Adductor canal block   Pre-Anesthetic Checklist: , timeout performed,  Correct Patient, Correct Site, Correct Laterality,  Correct Procedure, Correct Position, site marked,  Risks and benefits discussed,  Surgical consent,  Pre-op evaluation,  At surgeon's request and post-op pain management  Laterality: Right  Prep: chloraprep       Needles:  Injection technique: Single-shot  Needle Type: Echogenic Needle     Needle Length: 9cm  Needle Gauge: 21     Additional Needles:   Procedures:,,,, ultrasound used (permanent image in chart),,    Narrative:  Start time: 07/17/2021 1:50 PM End time: 07/17/2021 1:54 PM Injection made incrementally with aspirations every 5 mL.  Performed by: Personally  Anesthesiologist: Catalina Gravel, MD  Additional Notes: No pain on injection. No increased resistance to injection. Injection made in 5cc increments.  Good needle visualization.  Patient tolerated procedure well.

## 2021-07-18 ENCOUNTER — Encounter (HOSPITAL_BASED_OUTPATIENT_CLINIC_OR_DEPARTMENT_OTHER): Payer: Self-pay | Admitting: Orthopedic Surgery

## 2021-07-29 ENCOUNTER — Other Ambulatory Visit: Payer: Self-pay | Admitting: Cardiology

## 2021-07-30 DIAGNOSIS — S82841A Displaced bimalleolar fracture of right lower leg, initial encounter for closed fracture: Secondary | ICD-10-CM | POA: Diagnosis not present

## 2021-07-30 DIAGNOSIS — Z4889 Encounter for other specified surgical aftercare: Secondary | ICD-10-CM | POA: Diagnosis not present

## 2021-08-02 DIAGNOSIS — Z23 Encounter for immunization: Secondary | ICD-10-CM | POA: Diagnosis not present

## 2021-08-21 ENCOUNTER — Emergency Department (HOSPITAL_COMMUNITY)
Admission: EM | Admit: 2021-08-21 | Discharge: 2021-08-21 | Disposition: A | Discharge status: Home / Self Care | Payer: Medicare Other | Attending: Emergency Medicine | Admitting: Emergency Medicine

## 2021-08-21 ENCOUNTER — Other Ambulatory Visit: Payer: Self-pay

## 2021-08-21 ENCOUNTER — Encounter (HOSPITAL_COMMUNITY): Payer: Self-pay | Admitting: *Deleted

## 2021-08-21 ENCOUNTER — Emergency Department (HOSPITAL_COMMUNITY): Payer: Medicare Other

## 2021-08-21 DIAGNOSIS — I10 Essential (primary) hypertension: Secondary | ICD-10-CM | POA: Insufficient documentation

## 2021-08-21 DIAGNOSIS — I2510 Atherosclerotic heart disease of native coronary artery without angina pectoris: Secondary | ICD-10-CM | POA: Diagnosis not present

## 2021-08-21 DIAGNOSIS — I517 Cardiomegaly: Secondary | ICD-10-CM | POA: Diagnosis not present

## 2021-08-21 DIAGNOSIS — Z79899 Other long term (current) drug therapy: Secondary | ICD-10-CM | POA: Insufficient documentation

## 2021-08-21 DIAGNOSIS — E039 Hypothyroidism, unspecified: Secondary | ICD-10-CM | POA: Diagnosis not present

## 2021-08-21 LAB — URINALYSIS, ROUTINE W REFLEX MICROSCOPIC
Bilirubin Urine: NEGATIVE
Glucose, UA: NEGATIVE mg/dL
Hgb urine dipstick: NEGATIVE
Ketones, ur: NEGATIVE mg/dL
Leukocytes,Ua: NEGATIVE
Nitrite: NEGATIVE
Protein, ur: NEGATIVE mg/dL
Specific Gravity, Urine: 1.015 (ref 1.005–1.030)
pH: 7 (ref 5.0–8.0)

## 2021-08-21 LAB — CBC
HCT: 40.7 % (ref 36.0–46.0)
Hemoglobin: 13.4 g/dL (ref 12.0–15.0)
MCH: 29.1 pg (ref 26.0–34.0)
MCHC: 32.9 g/dL (ref 30.0–36.0)
MCV: 88.3 fL (ref 80.0–100.0)
Platelets: 243 10*3/uL (ref 150–400)
RBC: 4.61 MIL/uL (ref 3.87–5.11)
RDW: 14 % (ref 11.5–15.5)
WBC: 10.6 10*3/uL — ABNORMAL HIGH (ref 4.0–10.5)
nRBC: 0 % (ref 0.0–0.2)

## 2021-08-21 LAB — BASIC METABOLIC PANEL
Anion gap: 7 (ref 5–15)
BUN: 17 mg/dL (ref 8–23)
CO2: 27 mmol/L (ref 22–32)
Calcium: 9.6 mg/dL (ref 8.9–10.3)
Chloride: 104 mmol/L (ref 98–111)
Creatinine, Ser: 0.8 mg/dL (ref 0.44–1.00)
GFR, Estimated: >60 mL/min (ref >60)
Glucose, Bld: 123 mg/dL — ABNORMAL HIGH (ref 70–99)
Potassium: 4 mmol/L (ref 3.5–5.1)
Sodium: 138 mmol/L (ref 135–145)

## 2021-08-21 MED ORDER — LISINOPRIL 10 MG PO TABS: 10.0000 mg | ORAL_TABLET | Freq: Every day | ORAL | 1 refills | Status: DC

## 2021-08-21 MED ORDER — LISINOPRIL 10 MG PO TABS
10.0000 mg | ORAL_TABLET | Freq: Every day | ORAL | Status: DC
  Administered 2021-08-21: 10 mg via ORAL
  Filled 2021-08-21: qty 1

## 2021-08-21 NOTE — ED Triage Notes (Signed)
Pt states she was at home when she started to have a headache and some nausea; pt states she felt like she was having some heart palpitations and she took her BP at home and it was reading 199/91;  pt states she is usually in the 947'M with her systolic

## 2021-08-21 NOTE — ED Provider Notes (Signed)
[facility] EMERGENCY DEPARTMENT Provider Note   CSN: [id number] Arrival date & time: 08/21/21  2046     History Chief Complaint  Patient presents with   Hypertension    Casey Kramer is a 70 y.o. female.   Hypertension   This patient is a 70 year old female with a known history of coronary disease hyperlipidemia and hypertension who is currently taking carvedilol for her blood pressure, she stopped taking hydrochlorothiazide after breaking her ankle last month because it was too difficult to go back and forth to the bathroom.  Earlier today the patient started to have a bit of a headache and felt nauseated, felt like she was lightheaded, she took her blood pressure at home thinking that it was elevated and it came back at close to 245 systolic making her very concerned.  She denies chest pain shortness of breath diarrhea or any other symptoms.  Her symptoms are improving spontaneously.  Her blood pressure on arrival was 170/79.  She does endorse drinking a cup of coffee this morning followed by a peppermint latte that her husband brought home from the store, it was just after this that she started to have symptoms.    She also notes that last week or 2 she has had some intermittent dysuria but is currently not having any  Past Medical History:  Diagnosis Date   CAD (coronary artery disease)    Hyperlipidemia    Hypertension    Hypothyroid     Patient Active Problem List   Diagnosis Date Noted   Familial hyperlipidemia 08/24/2017    Past Surgical History:  Procedure Laterality Date   laproscopic endometrial surgrey     ORIF ANKLE FRACTURE Right 07/17/2021   Procedure: OPEN REDUCTION INTERNAL FIXATION (ORIF) right ankle bimalleolar fracture;  Surgeon: Wylene Simmer, MD;  Location: [place];  Service: Orthopedics;  Laterality: Right;  68min     OB History   No obstetric history on file.     Family History  Problem Relation Age of Onset   Heart  attack Mother    Heart disease Mother    Stroke Mother    Diabetes Father    Heart failure Father    Breast cancer Neg Hx     Social History   Tobacco Use   Smoking status: Never   Smokeless tobacco: Never  Vaping Use   Vaping Use: Never used  Substance Use Topics   Alcohol use: No   Drug use: Never    Home Medications Prior to Admission medications   Medication Sig Start Date End Date Taking? Authorizing Provider  lisinopril (ZESTRIL) 10 MG tablet Take 1 tablet (10 mg total) by mouth daily. 08/21/21  Yes Noemi Chapel, MD  carvedilol (COREG) 12.5 MG tablet TAKE 1 TABLET (12.5 MG TOTAL) BY MOUTH 2 (TWO) TIMES DAILY. 05/15/20   Lelon Perla, MD  ezetimibe (ZETIA) 10 MG tablet Take 1 tablet (10 mg total) by mouth daily. 04/10/20 07/26/20  Lelon Perla, MD  hydrochlorothiazide (MICROZIDE) 12.5 MG capsule TAKE 1 CAPSULE BY MOUTH EVERY DAY 07/29/21   Lelon Perla, MD  levothyroxine (SYNTHROID) 88 MCG tablet Take 88 mcg by mouth daily before breakfast.    [provider]  meclizine (ANTIVERT) 25 MG tablet Take 1 tablet (25 mg total) by mouth 4 (four) times daily as needed for dizziness. 07/26/20   Corena Herter, PA-C  rosuvastatin (CRESTOR) 5 MG tablet TAKE 1 TABLET BY MOUTH 3 (THREE) TIMES A WEEK.  04/01/21   Lelon Perla, MD    Allergies    Statins, Amlodipine, Aspirin, and Naproxen  Review of Systems   Review of Systems  All other systems reviewed and are negative.  Physical Exam Updated Vital Signs BP (!) 176/86   Pulse 65   Temp 97.7 F (36.5 C) (Oral)   Resp 18   Ht 1.575 m (5\' 2" )   Wt 68.9 kg   SpO2 99%   BMI 27.80 kg/m   Physical Exam Vitals and nursing note reviewed.  Constitutional:      General: She is not in acute distress.    Appearance: She is well-developed.  HENT:     Head: Normocephalic and atraumatic.     Nose: No congestion or rhinorrhea.     Mouth/Throat:     Pharynx: No oropharyngeal exudate.  Eyes:     General:  No scleral icterus.       Right eye: No discharge.        Left eye: No discharge.     Conjunctiva/sclera: Conjunctivae normal.     Pupils: Pupils are equal, round, and reactive to light.  Neck:     Thyroid: No thyromegaly.     Vascular: No JVD.  Cardiovascular:     Rate and Rhythm: Normal rate and regular rhythm.     Heart sounds: Normal heart sounds. No murmur heard.   No friction rub. No gallop.  Pulmonary:     Effort: Pulmonary effort is normal. No respiratory distress.     Breath sounds: Normal breath sounds. No wheezing or rales.  Abdominal:     General: Bowel sounds are normal. There is no distension.     Palpations: Abdomen is soft. There is no mass.     Tenderness: There is no abdominal tenderness.  Musculoskeletal:        General: No tenderness. Normal range of motion.     Cervical back: Normal range of motion and neck supple.     Comments: The right lower extremity is currently in a cast from the mid calf through the foot, there is normal sensation and capillary refill of all the toes  Lymphadenopathy:     Cervical: No cervical adenopathy.  Skin:    General: Skin is warm and dry.     Findings: No erythema or rash.  Neurological:     Mental Status: She is alert.     Coordination: Coordination normal.     Comments: Awake alert and able to follow commands without difficulty  Psychiatric:        Behavior: Behavior normal.    ED Results / Procedures / Treatments   Labs (all labs ordered are listed, but only abnormal results are displayed) Labs Reviewed  BASIC METABOLIC PANEL - Abnormal; Notable for the following components:      Result Value   Glucose, Bld 123 (*)    All other components within normal limits  CBC - Abnormal; Notable for the following components:   WBC 10.6 (*)    All other components within normal limits  URINALYSIS, ROUTINE W REFLEX MICROSCOPIC - Abnormal; Notable for the following components:   APPearance HAZY (*)    All other components within  normal limits    EKG EKG Interpretation  Date/Time:  Thursday August 21 2021 21:03:01 EST Ventricular Rate:  75 PR Interval:  160 QRS Duration: 74 QT Interval:  376 QTC Calculation: 419 R Axis:   -39 Text Interpretation: Normal sinus rhythm Left axis deviation Cannot  rule out Anterior infarct , age undetermined Abnormal ECG Confirmed by Noemi Chapel [phone]) on 08/21/2021 10:35:26 PM  Radiology DG Chest 2 View  Result Date: 08/21/2021 CLINICAL DATA:  Palpitation. EXAM: CHEST - 2 VIEW COMPARISON:  Chest radiograph dated 04/05/2019. FINDINGS: No focal consolidation, pleural effusion, pneumothorax. 1 cardiomegaly. No acute osseous pathology. IMPRESSION: No active cardiopulmonary disease. Electronically Signed   By: Anner Crete M.D.   On: 08/21/2021 22:33    Procedures Procedures   Medications Ordered in ED Medications  lisinopril (ZESTRIL) tablet 10 mg (10 mg Oral Given 08/21/21 2253)    ED Course  I have reviewed the triage vital signs and the nursing notes.  Pertinent labs & imaging results that were available during my care of the patient were reviewed by me and considered in my medical decision making (see chart for details).    MDM Rules/Calculators/A&P                           The patient's chest x-ray is totally unremarkable, the EKG did not show any acute findings either.  Lab work was reassuring showing no hypokalemia or anemia.  At this time the patient will need a urinalysis and I will give her a dose of lisinopril which I will start short-term until her cast comes off at which time she can go back to hydrochlorothiazide.  The patient is agreeable.  I do not think she has anything else pathological going on, certainly does not have any symptoms of classic angina, no symptoms of stroke and at this time is virtually symptom-free with an improving blood pressure  Urinalysis is negative, blood pressure is 176/86, the patient is stable for discharge  Final  Clinical Impression(s) / ED Diagnoses Final diagnoses:  Primary hypertension    Rx / DC Orders ED Discharge Orders          Ordered    lisinopril (ZESTRIL) 10 MG tablet  Daily        08/21/21 2329             Noemi Chapel, MD 08/21/21 2330

## 2021-08-21 NOTE — Discharge Instructions (Signed)
Please start taking lisinopril 10 mg daily, take this once a day for the next 30 days, you will need to follow-up with your family doctor for a repeat blood pressure check within 2 weeks.  I would encourage you to take your blood pressure 2 hours after taking her morning medications.  When you get home tonight finished taking your normal evening medicines, you may take lisinopril at night before bed.  There is a small risk of having a cough or swelling of the lips when you take this medication, if that occurs stop the medication and call your doctor immediately.  If you have swelling of the lips or tongue come to the emergency department immediately.  Your urine sample was normal without any signs of infection

## 2021-08-27 DIAGNOSIS — I10 Essential (primary) hypertension: Secondary | ICD-10-CM | POA: Diagnosis not present

## 2021-08-27 DIAGNOSIS — S82891A Other fracture of right lower leg, initial encounter for closed fracture: Secondary | ICD-10-CM | POA: Diagnosis not present

## 2021-08-29 DIAGNOSIS — Z4889 Encounter for other specified surgical aftercare: Secondary | ICD-10-CM | POA: Diagnosis not present

## 2021-08-29 DIAGNOSIS — S82841A Displaced bimalleolar fracture of right lower leg, initial encounter for closed fracture: Secondary | ICD-10-CM | POA: Diagnosis not present

## 2021-09-01 DIAGNOSIS — H2513 Age-related nuclear cataract, bilateral: Secondary | ICD-10-CM | POA: Diagnosis not present

## 2021-09-01 DIAGNOSIS — D3131 Benign neoplasm of right choroid: Secondary | ICD-10-CM | POA: Diagnosis not present

## 2021-09-12 DIAGNOSIS — Z6829 Body mass index (BMI) 29.0-29.9, adult: Secondary | ICD-10-CM | POA: Diagnosis not present

## 2021-09-12 DIAGNOSIS — D496 Neoplasm of unspecified behavior of brain: Secondary | ICD-10-CM | POA: Diagnosis not present

## 2021-09-12 DIAGNOSIS — I10 Essential (primary) hypertension: Secondary | ICD-10-CM | POA: Diagnosis not present

## 2021-09-16 DIAGNOSIS — Z20822 Contact with and (suspected) exposure to covid-19: Secondary | ICD-10-CM | POA: Diagnosis not present

## 2021-09-24 DIAGNOSIS — I10 Essential (primary) hypertension: Secondary | ICD-10-CM | POA: Diagnosis not present

## 2021-09-24 DIAGNOSIS — S82891A Other fracture of right lower leg, initial encounter for closed fracture: Secondary | ICD-10-CM | POA: Diagnosis not present

## 2021-09-24 DIAGNOSIS — E785 Hyperlipidemia, unspecified: Secondary | ICD-10-CM | POA: Diagnosis not present

## 2021-09-26 DIAGNOSIS — Z4889 Encounter for other specified surgical aftercare: Secondary | ICD-10-CM | POA: Diagnosis not present

## 2021-09-26 DIAGNOSIS — S82841A Displaced bimalleolar fracture of right lower leg, initial encounter for closed fracture: Secondary | ICD-10-CM | POA: Diagnosis not present

## 2021-10-02 DIAGNOSIS — R42 Dizziness and giddiness: Secondary | ICD-10-CM | POA: Diagnosis not present

## 2021-10-02 DIAGNOSIS — I10 Essential (primary) hypertension: Secondary | ICD-10-CM | POA: Diagnosis not present

## 2021-10-02 DIAGNOSIS — E785 Hyperlipidemia, unspecified: Secondary | ICD-10-CM | POA: Diagnosis not present

## 2021-10-02 DIAGNOSIS — R110 Nausea: Secondary | ICD-10-CM | POA: Diagnosis not present

## 2021-10-02 DIAGNOSIS — R002 Palpitations: Secondary | ICD-10-CM | POA: Diagnosis not present

## 2021-10-02 DIAGNOSIS — S82891A Other fracture of right lower leg, initial encounter for closed fracture: Secondary | ICD-10-CM | POA: Diagnosis not present

## 2021-10-02 DIAGNOSIS — E039 Hypothyroidism, unspecified: Secondary | ICD-10-CM | POA: Diagnosis not present

## 2021-10-07 ENCOUNTER — Telehealth: Payer: Self-pay | Admitting: Cardiology

## 2021-10-07 DIAGNOSIS — I10 Essential (primary) hypertension: Secondary | ICD-10-CM

## 2021-10-07 DIAGNOSIS — Z79899 Other long term (current) drug therapy: Secondary | ICD-10-CM

## 2021-10-07 DIAGNOSIS — I2510 Atherosclerotic heart disease of native coronary artery without angina pectoris: Secondary | ICD-10-CM

## 2021-10-07 MED ORDER — LISINOPRIL 10 MG PO TABS: 20.0000 mg | ORAL_TABLET | Freq: Every day | ORAL | 1 refills | Status: DC

## 2021-10-07 MED ORDER — AMLODIPINE BESYLATE 5 MG PO TABS: 5.0000 mg | ORAL_TABLET | Freq: Every day | ORAL | 3 refills | Status: DC

## 2021-10-07 MED ORDER — LISINOPRIL 40 MG PO TABS
20.0000 mg | ORAL_TABLET | Freq: Every day | ORAL | 1 refills | Status: DC
Start: 2021-10-07 — End: 2021-10-08

## 2021-10-07 NOTE — Addendum Note (Signed)
Addended by: Waylan Rocher on: 10/07/2021 09:50 AM   Modules accepted: Orders

## 2021-10-07 NOTE — Telephone Encounter (Signed)
Pt c/o BP issue: STAT if pt c/o blurred vision, one-sided weakness or slurred speech  1. What are your last 5 BP readings? 156/83; 154/79; 147/80; 144/85;   2. Are you having any other symptoms (ex. Dizziness, headache, blurred vision, passed out)? Nauseated, blurred vision, palpitations, dizziness  3. What is your BP issue? Patient says that she has been experiencing BP problems. Went to hospital and it was 200/90. Went to PCP 169/can't remember bottom number. No appetite, very nauseous, dizzy. Started two new BP medications. One from hospital and one from PCP

## 2021-10-07 NOTE — Telephone Encounter (Signed)
Pt called in and states that she went to the PCP, APP and states that the PA told her she thinks that she is in and out of  AFIB but this did not come up on the EKG. Pt states that when she got discharged from the hospital in November that she stopped her HCTZ because she was going to the bathroom all the time. Informed pt that urinating is the only was to get the fluid out of the body but she states that she ' does not like' this. She states that the BP 200 reading was while she was in the hosp around 11-10 and the 169 reading was while she was at her PCP discussing the AFIB and palpitations. She does not think that she has had any palpitations while at home just the time at her PCP with the PA. They increased her lisinopril to 20mg  but she does not think that this is bringing her BP down. She states that BP has been running 156/83; 154/79; 147/80; 144/85. She states that she has nausea and states that she is jittery as well. Pt states that she had palpitations while she was at the PCP she had a palpitation. But nothing since. She states that she is nauseous daily sometimes more than others. She states that "it is all she can do to eat a cracker or something to take her medication". She will continue to take and make a log of her BP/HR and symptoms. She will send them via [facility] for MD review. She has had no weight loss or dietary (salt) changes. She states that she "only had the palpitations that one time at the PCP when she was listening" PA told her to get the [place] mobile" to see if/when she is in AFIB. She has not yet purchased. She states that she is unsure if she will purchase. Sh had had decreased activity/mobility due to her broken ankle. She will continue to monitor BP/symptoms and call back if anything else changes.

## 2021-10-07 NOTE — Addendum Note (Signed)
Addended by: Waylan Rocher on: 10/07/2021 02:23 PM   Modules accepted: Orders

## 2021-10-07 NOTE — Telephone Encounter (Signed)
Pt informed of providers result & recommendations. Pt verbalized understanding. She is very hesitant on increasing medication again. Informed pt that this is what needs to be done due to increased BP/HR. Verbalized understanding. She will return for lab work in 1 week

## 2021-10-08 ENCOUNTER — Other Ambulatory Visit: Payer: Self-pay

## 2021-10-08 MED ORDER — LISINOPRIL 40 MG PO TABS: 40.0000 mg | ORAL_TABLET | Freq: Every day | ORAL | 1 refills | Status: DC

## 2021-10-15 DIAGNOSIS — I10 Essential (primary) hypertension: Secondary | ICD-10-CM | POA: Diagnosis not present

## 2021-10-15 DIAGNOSIS — S82841D Displaced bimalleolar fracture of right lower leg, subsequent encounter for closed fracture with routine healing: Secondary | ICD-10-CM | POA: Diagnosis not present

## 2021-10-15 DIAGNOSIS — I2510 Atherosclerotic heart disease of native coronary artery without angina pectoris: Secondary | ICD-10-CM | POA: Diagnosis not present

## 2021-10-15 DIAGNOSIS — Z79899 Other long term (current) drug therapy: Secondary | ICD-10-CM | POA: Diagnosis not present

## 2021-10-16 LAB — BASIC METABOLIC PANEL
BUN/Creatinine Ratio: 13 (ref 12–28)
BUN: 11 mg/dL (ref 8–27)
CO2: 24 mmol/L (ref 20–29)
Calcium: 10 mg/dL (ref 8.7–10.3)
Chloride: 101 mmol/L (ref 96–106)
Creatinine, Ser: 0.84 mg/dL (ref 0.57–1.00)
Glucose: 95 mg/dL (ref 70–99)
Potassium: 4.1 mmol/L (ref 3.5–5.2)
Sodium: 142 mmol/L (ref 134–144)
eGFR: 75 mL/min/{1.73_m2} (ref >59)

## 2021-10-21 ENCOUNTER — Telehealth: Payer: Self-pay | Admitting: Cardiology

## 2021-10-21 DIAGNOSIS — S82841D Displaced bimalleolar fracture of right lower leg, subsequent encounter for closed fracture with routine healing: Secondary | ICD-10-CM | POA: Diagnosis not present

## 2021-10-21 MED ORDER — AMLODIPINE BESYLATE 10 MG PO TABS: 10.0000 mg | ORAL_TABLET | Freq: Every day | ORAL | 3 refills | Status: DC

## 2021-10-21 NOTE — Telephone Encounter (Signed)
Spoke with pt, Aware of dr crenshaw's recommendations.  °

## 2021-10-21 NOTE — Telephone Encounter (Signed)
PT is reaching out to provide bp readings:  01/07:  127/70 hr 62 am             175/86 hr 70 pm  01/08:  146/76 hr 63 am             157/85 hr 65 pm  01/09:  139/78 hr 63 am             184/87 hr 68 pm  01/10:  146/77 hr 63 am

## 2021-10-21 NOTE — Telephone Encounter (Signed)
I will forward bp readings to Dr.Crenshaw for review.

## 2021-10-27 DIAGNOSIS — M25571 Pain in right ankle and joints of right foot: Secondary | ICD-10-CM | POA: Diagnosis not present

## 2021-10-27 DIAGNOSIS — S82841D Displaced bimalleolar fracture of right lower leg, subsequent encounter for closed fracture with routine healing: Secondary | ICD-10-CM | POA: Diagnosis not present

## 2021-10-30 ENCOUNTER — Telehealth: Payer: Self-pay | Admitting: Cardiology

## 2021-10-30 ENCOUNTER — Other Ambulatory Visit: Payer: Self-pay | Admitting: Cardiology

## 2021-10-30 NOTE — Telephone Encounter (Signed)
Spoke with pt, Aware of dr crenshaw's recommendations.  °

## 2021-10-30 NOTE — Telephone Encounter (Signed)
Patient c/o Palpitations:  High priority if patient c/o lightheadedness, shortness of breath, or chest pain  How long have you had palpitations/irregular HR/ Afib? Are you having the symptoms now? Yes   Are you currently experiencing lightheadedness, SOB or CP? Yes   Do you have a history of afib (atrial fibrillation) or irregular heart rhythm? Yes   Have you checked your BP or HR? (document readings if available): 132/77  Are you experiencing any other symptoms? Lightheaded and dizzy

## 2021-10-30 NOTE — Progress Notes (Signed)
Cardiology Office Note:    Date:  10/31/2021   ID:  Casey Kramer, DOB May 23, 1951, MRN [id number]  PCP:  No primary care provider on file.  Cardiologist:  Kirk Ruths, MD  Electrophysiologist:  None   Referring MD: Burnard Bunting, MD   Chief Complaint  Patient presents with   Palpitations    History of Present Illness:    Casey Kramer is a 71 y.o. female with a hx of CAD, hypertension, hyperlipidemia, hypothyroidism who presents for follow-up.  She follows with Dr. Stanford Breed, last seen 01/30/2021.  She had a CT scan which showed coronary calcification.  Underwent nuclear stress test 04/2017 which showed no ischemia.  Echocardiogram 05/2019 showed normal LV function, grade 2 diastolic dysfunction.  She reports that she broke her ankle in October and since that time has been having issues with elevated BP, has been as high as 200s over 100s.  Antihypertensives have been increased, currently on Coreg 12.5 mg twice daily, amlodipine 10 mg daily and lisinopril 40 mg daily.  BP log from last week shows BP 110s to 150s over 60s to 80s.  Has been having intermittent episodes of lightheadedness.  Occurs after taking BP meds, has not checked BP when feeling lightheaded.  Also reports has been having palpitations.  States that palpitations have been daily, typically occur for seconds and resolves.  However she had a night where she has palpitations that lasted for hours.  She denies any chest pain, dyspnea, or syncope.  Reports she has had persistent swelling in right lower extremity since ankle fracture.  Past Medical History:  Diagnosis Date   CAD (coronary artery disease)    Hyperlipidemia    Hypertension    Hypothyroid     Past Surgical History:  Procedure Laterality Date   laproscopic endometrial surgrey     ORIF ANKLE FRACTURE Right 07/17/2021   Procedure: OPEN REDUCTION INTERNAL FIXATION (ORIF) right ankle bimalleolar fracture;  Surgeon: Wylene Simmer, MD;  Location: [place];  Service: Orthopedics;  Laterality: Right;  18min    Current Medications: Current Meds  Medication Sig   amLODipine (NORVASC) 10 MG tablet Take 1 tablet (10 mg total) by mouth daily.   aspirin 81 MG EC tablet take one a day   carvedilol (COREG) 12.5 MG tablet TAKE 1 TABLET (12.5 MG TOTAL) BY MOUTH 2 (TWO) TIMES DAILY.   Cholecalciferol (VITAMIN D-1000 MAX ST PO)    levothyroxine (SYNTHROID) 88 MCG tablet Take 88 mcg by mouth daily before breakfast.   lisinopril (ZESTRIL) 40 MG tablet TAKE 1 TABLET BY MOUTH EVERY DAY   meclizine (ANTIVERT) 25 MG tablet Take 1 tablet (25 mg total) by mouth 4 (four) times daily as needed for dizziness.   rosuvastatin (CRESTOR) 5 MG tablet TAKE 1 TABLET BY MOUTH 3 (THREE) TIMES A WEEK.   Vitamin D-Vitamin K (VITAMIN K2-VITAMIN D3) 45-2000 MCG-UNIT CAPS one tablet PO daily   [DISCONTINUED] aspirin 81 MG chewable tablet Chew 81 mg by mouth daily.     Allergies:   Statins, Amlodipine, Aspirin, and Naproxen   Social History   Socioeconomic History   Marital status: Married    Spouse name: Not on file   Number of children: 2   Years of education: Not on file   Highest education level: Not on file  Occupational History   Not on file  Tobacco Use   Smoking status: Never   Smokeless tobacco: Never  Vaping Use   Vaping Use: Never used  Substance and Sexual Activity   Alcohol use: No   Drug use: Never   Sexual activity: Not on file  Other Topics Concern   Not on file  Social History Narrative   Not on file   Social Determinants of Health   Financial Resource Strain: Not on file  Food Insecurity: Not on file  Transportation Needs: Not on file  Physical Activity: Not on file  Stress: Not on file  Social Connections: Not on file     Family History: The patient's family history includes Diabetes in her father; Heart attack in her mother; Heart disease in her mother; Heart failure in her father; Stroke in her mother. There is no  history of Breast cancer.  ROS:   Please see the history of present illness.     All other systems reviewed and are negative.  EKGs/Labs/Other Studies Reviewed:    The following studies were reviewed today:   EKG:   10/31/21: Normal sinus rhythm, rate 67, no ST abnormality  Recent Labs: 03/03/2021: ALT 15 08/21/2021: Hemoglobin 13.4; Platelets 243 10/15/2021: BUN 11; Creatinine, Ser 0.84; Potassium 4.1; Sodium 142  Recent Lipid Panel    Component Value Date/Time   CHOL 210 (H) 02/27/2021 0909   TRIG 90 02/27/2021 0909   HDL 56 02/27/2021 0909   CHOLHDL 3.8 02/27/2021 0909   LDLCALC 138 (H) 02/27/2021 0909    Physical Exam:    VS:  BP 135/71    Pulse 67    Ht 5\' 2"  (1.575 m)    Wt 153 lb 3.2 oz (69.5 kg)    SpO2 96%    BMI 28.02 kg/m     Wt Readings from Last 3 Encounters:  10/31/21 153 lb 3.2 oz (69.5 kg)  08/21/21 152 lb (68.9 kg)  07/17/21 156 lb 4.9 oz (70.9 kg)     GEN:  Well nourished, well developed in no acute distress HEENT: Normal NECK: No JVD; No carotid bruits LYMPHATICS: No lymphadenopathy CARDIAC: RRR, no murmurs, rubs, gallops RESPIRATORY:  Clear to auscultation without rales, wheezing or rhonchi  ABDOMEN: Soft, non-tender, non-distended MUSCULOSKELETAL: Right lower extremity 1+ edema SKIN: Warm and dry NEUROLOGIC:  Alert and oriented x 3 PSYCHIATRIC:  Normal affect   ASSESSMENT:    1. Palpitations   2. Lower extremity edema   3. Lightheadedness   4. Coronary artery disease involving native coronary artery of native heart without angina pectoris   5. Essential hypertension   6. Hyperlipidemia, unspecified hyperlipidemia type    PLAN:    Palpitations: Description concerning for arrhythmia, evaluate with Zio patch x7 days.  Will check TSH, CMET  Lightheadedness: Occurs after taking BP meds, concerned that BP may be dropping low.  Asked her to check BP when having episodes of lightheadedness and let us know the results. Check CBC  Right lower  extremity edema: Has occurred since ankle fracture in October.  Check RLE duplex to rule out DVT  CAD: Continue aspirin, statin  Hypertension: On amlodipine 10 mg daily, carvedilol 12.5 mg twice daily, lisinopril 40 mg daily.  Appears controlled but have asked her to check BP when having lightheaded episodes as above, may have to back off antihypertensives   Hyperlipidemia: On rosuvastatin, Zetia   Has appointment scheduled next month with Dr. Stanford Breed   Medication Adjustments/Labs and Tests Ordered: Current medicines are reviewed at length with the patient today.  Concerns regarding medicines are outlined above.  Orders Placed This Encounter  Procedures   TSH   T4,  free   Comprehensive metabolic panel   CBC   LONG TERM MONITOR (3-14 DAYS)   EKG 12-Lead   VAS [place] LOWER EXTREMITY VENOUS (DVT)   No orders of the defined types were placed in this encounter.   Patient Instructions  Medication Instructions:  No Changes In Medications at this time.  *If you need a refill on your cardiac medications before your next appointment, please call your pharmacy*  Lab Work: TSH, Free T4, CBC, CMP today  If you have labs (blood work) drawn today and your tests are completely normal, you will receive your results only by: [place] (if you have MyChart) OR A paper copy in the mail If you have any lab test that is abnormal or we need to change your treatment, we will call you to review the results.  Testing/Procedures:  Your physician has requested that you have a lower  extremity venous duplex. This test is an ultrasound of the veins in the legs. It looks at venous blood flow that carries blood from the heart to the legs. Allow one hour for a Lower Venous exam. There are no restrictions or special instructions.   ZIO XT- Long Term Monitor Instructions   Your physician has requested you wear your ZIO patch monitor___7____days.   This is a single patch monitor.  [employer] supplies one  patch monitor per enrollment.  Additional stickers are not available.   Please do not apply patch if you will be having a Nuclear Stress Test, Echocardiogram, Cardiac CT, MRI, or Chest Xray during the time frame you would be wearing the monitor. The patch cannot be worn during these tests.  You cannot remove and re-apply the ZIO XT patch monitor.   Your ZIO patch monitor will be sent USPS Priority mail from [facility] directly to your home address. The monitor may also be mailed to a PO BOX if home delivery is not available.   It may take 3-5 days to receive your monitor after you have been enrolled.   Once you have received you monitor, please review enclosed instructions.  Your monitor has already been registered assigning a specific monitor serial # to you.   Applying the monitor   Shave hair from upper left chest.   Hold abrader disc by orange tab.  Rub abrader in 40 strokes over left upper chest as indicated in your monitor instructions.   Clean area with 4 enclosed alcohol pads .  Use all pads to assure are is cleaned thoroughly.  Let dry.   Apply patch as indicated in monitor instructions.  Patch will be place under collarbone on left side of chest with arrow pointing upward.   Rub patch adhesive wings for 2 minutes.Remove white label marked "1".  Remove white label marked "2".  Rub patch adhesive wings for 2 additional minutes.   While looking in a mirror, press and release button in center of patch.  A small green light will flash 3-4 times .  This will be your only indicator the monitor has been turned on.     Do not shower for the first 24 hours.  You may shower after the first 24 hours.   Press button if you feel a symptom. You will hear a small click.  Record Date, Time and Symptom in the Patient Log Book.   When you are ready to remove patch, follow instructions on last 2 pages of Patient Log Book.  Stick patch monitor onto last page of Patient Log  Book.   Place  Patient Log Book in [place] box.  Use locking tab on box and tape box closed securely.  The [employer] has [employer] on it.  Please place in mailbox as soon as possible.  Your physician should have your test results approximately 7 days after the monitor has been mailed back to [facility].   Call [place] at [phone] if you have questions regarding your ZIO XT patch monitor.  Call them immediately if you see an orange light blinking on your monitor.   If your monitor falls off in less than 4 days contact our Monitor department at [phone].  If your monitor becomes loose or falls off after 4 days call [employer] at [phone] for suggestions on securing your monitor.    Follow-Up: At [facility], you and your health needs are our priority.  As part of our continuing mission to provide you with exceptional heart care, we have created designated Provider Care Teams.  These Care Teams include your primary Cardiologist (physician) and Advanced Practice Providers (APPs -  Physician Assistants and Nurse Practitioners) who all work together to provide you with the care you need, when you need it.  Your next appointment:   AS SCHEDULED   The format for your next appointment:   In Person  Provider:   Dr. Stanford Breed    Signed, Donato Heinz, MD  10/31/2021 3:08 PM    [facility]

## 2021-10-30 NOTE — Telephone Encounter (Signed)
Spoke with pt regarding palpitations, dizzines, and fluttering feeling in her chest. Pt states that these feeling started yesterday around 4pm. Pt states that she waiting until 8:30pm to take there evening medications which include carvedilol 12.5mg  and amlodipine 10mg . Pt states that shortly after she did feel well and felt nauseous so she decided to go to bed. Pt states that multiple times over night at midnight and again at 2am she woke up with fluttering and palpitations. Pt states that this morning she took her medications, including coreg 12.5mg  and lisinopril 40mg  along with the rest of her morning medications. Pt states that after an hour or so she took her blood pressure with result 132/77. Pt also took blood pressure this afternoon with results 145/78. Pt states that she just doesn't feel quite right and has felt lightheaded and nauseous some throughout the day. Pt states that her heart rate is usually in the 60s but sometimes gets as high as 74bpm, she states that this is the highest she has seen it. Able to make pt an appointment in DOD slot for tomorrow to be evaluated. Will also send this message to Dr. Stanford Breed to further advise.

## 2021-10-31 ENCOUNTER — Ambulatory Visit (INDEPENDENT_AMBULATORY_CARE_PROVIDER_SITE_OTHER): Payer: Medicare Other

## 2021-10-31 ENCOUNTER — Ambulatory Visit (INDEPENDENT_AMBULATORY_CARE_PROVIDER_SITE_OTHER): Payer: Medicare Other | Admitting: Cardiology

## 2021-10-31 ENCOUNTER — Other Ambulatory Visit: Payer: Self-pay

## 2021-10-31 ENCOUNTER — Encounter: Payer: Self-pay | Admitting: Cardiology

## 2021-10-31 VITALS — BP 135/71 | HR 67 | Ht 62.0 in | Wt 153.2 lb

## 2021-10-31 DIAGNOSIS — E785 Hyperlipidemia, unspecified: Secondary | ICD-10-CM | POA: Diagnosis not present

## 2021-10-31 DIAGNOSIS — R42 Dizziness and giddiness: Secondary | ICD-10-CM

## 2021-10-31 DIAGNOSIS — R600 Localized edema: Secondary | ICD-10-CM

## 2021-10-31 DIAGNOSIS — I2510 Atherosclerotic heart disease of native coronary artery without angina pectoris: Secondary | ICD-10-CM | POA: Diagnosis not present

## 2021-10-31 DIAGNOSIS — I10 Essential (primary) hypertension: Secondary | ICD-10-CM | POA: Diagnosis not present

## 2021-10-31 DIAGNOSIS — R002 Palpitations: Secondary | ICD-10-CM

## 2021-10-31 NOTE — Patient Instructions (Signed)
Medication Instructions:  No Changes In Medications at this time.  *If you need a refill on your cardiac medications before your next appointment, please call your pharmacy*  Lab Work: TSH, Free T4, CBC, CMP today  If you have labs (blood work) drawn today and your tests are completely normal, you will receive your results only by: [place] (if you have MyChart) OR A paper copy in the mail If you have any lab test that is abnormal or we need to change your treatment, we will call you to review the results.  Testing/Procedures:  Your physician has requested that you have a lower  extremity venous duplex. This test is an ultrasound of the veins in the legs. It looks at venous blood flow that carries blood from the heart to the legs. Allow one hour for a Lower Venous exam. There are no restrictions or special instructions.   ZIO XT- Long Term Monitor Instructions   Your physician has requested you wear your ZIO patch monitor___7____days.   This is a single patch monitor.  [employer] supplies one patch monitor per enrollment.  Additional stickers are not available.   Please do not apply patch if you will be having a Nuclear Stress Test, Echocardiogram, Cardiac CT, MRI, or Chest Xray during the time frame you would be wearing the monitor. The patch cannot be worn during these tests.  You cannot remove and re-apply the ZIO XT patch monitor.   Your ZIO patch monitor will be sent USPS Priority mail from [facility] directly to your home address. The monitor may also be mailed to a PO BOX if home delivery is not available.   It may take 3-5 days to receive your monitor after you have been enrolled.   Once you have received you monitor, please review enclosed instructions.  Your monitor has already been registered assigning a specific monitor serial # to you.   Applying the monitor   Shave hair from upper left chest.   Hold abrader disc by orange tab.  Rub abrader in 40 strokes  over left upper chest as indicated in your monitor instructions.   Clean area with 4 enclosed alcohol pads .  Use all pads to assure are is cleaned thoroughly.  Let dry.   Apply patch as indicated in monitor instructions.  Patch will be place under collarbone on left side of chest with arrow pointing upward.   Rub patch adhesive wings for 2 minutes.Remove white label marked "1".  Remove white label marked "2".  Rub patch adhesive wings for 2 additional minutes.   While looking in a mirror, press and release button in center of patch.  A small green light will flash 3-4 times .  This will be your only indicator the monitor has been turned on.     Do not shower for the first 24 hours.  You may shower after the first 24 hours.   Press button if you feel a symptom. You will hear a small click.  Record Date, Time and Symptom in the Patient Log Book.   When you are ready to remove patch, follow instructions on last 2 pages of Patient Log Book.  Stick patch monitor onto last page of Patient Log Book.   Place Patient Log Book in [place] box.  Use locking tab on box and tape box closed securely.  The [employer] and [employer] has [employer] on it.  Please place in mailbox as soon as possible.  Your physician should have  your test results approximately 7 days after the monitor has been mailed back to [facility].   Call [place] at [phone] if you have questions regarding your ZIO XT patch monitor.  Call them immediately if you see an orange light blinking on your monitor.   If your monitor falls off in less than 4 days contact our Monitor department at [phone].  If your monitor becomes loose or falls off after 4 days call [employer] at [phone] for suggestions on securing your monitor.    Follow-Up: At [facility], you and your health needs are our priority.  As part of our continuing mission to provide you with exceptional heart care, we have created designated  Provider Care Teams.  These Care Teams include your primary Cardiologist (physician) and Advanced Practice Providers (APPs -  Physician Assistants and Nurse Practitioners) who all work together to provide you with the care you need, when you need it.  Your next appointment:   AS SCHEDULED   The format for your next appointment:   In Person  Provider:   Dr. Stanford Breed

## 2021-10-31 NOTE — Progress Notes (Unsigned)
Enrolled patient for a 7 day Zio XT monitor to be mailed to patients home.  

## 2021-11-01 LAB — COMPREHENSIVE METABOLIC PANEL
ALT: 14 IU/L (ref 0–32)
AST: 22 IU/L (ref 0–40)
Albumin/Globulin Ratio: 1.5 (ref 1.2–2.2)
Albumin: 4.5 g/dL (ref 3.8–4.8)
Alkaline Phosphatase: 113 IU/L (ref 44–121)
BUN/Creatinine Ratio: 14 (ref 12–28)
BUN: 12 mg/dL (ref 8–27)
Bilirubin Total: 0.4 mg/dL (ref 0.0–1.2)
CO2: 25 mmol/L (ref 20–29)
Calcium: 10.3 mg/dL (ref 8.7–10.3)
Chloride: 102 mmol/L (ref 96–106)
Creatinine, Ser: 0.88 mg/dL (ref 0.57–1.00)
Globulin, Total: 3.1 g/dL (ref 1.5–4.5)
Glucose: 85 mg/dL (ref 70–99)
Potassium: 4.1 mmol/L (ref 3.5–5.2)
Sodium: 140 mmol/L (ref 134–144)
Total Protein: 7.6 g/dL (ref 6.0–8.5)
eGFR: 71 mL/min/{1.73_m2} (ref >59)

## 2021-11-01 LAB — CBC
Hematocrit: 43.2 % (ref 34.0–46.6)
Hemoglobin: 14.7 g/dL (ref 11.1–15.9)
MCH: 29.5 pg (ref 26.6–33.0)
MCHC: 34 g/dL (ref 31.5–35.7)
MCV: 87 fL (ref 79–97)
Platelets: 268 10*3/uL (ref 150–450)
RBC: 4.99 x10E6/uL (ref 3.77–5.28)
RDW: 14 % (ref 11.7–15.4)
WBC: 8.8 10*3/uL (ref 3.4–10.8)

## 2021-11-01 LAB — T4, FREE: Free T4: 1.21 ng/dL (ref 0.82–1.77)

## 2021-11-01 LAB — TSH: TSH: 13.5 u[IU]/mL — ABNORMAL HIGH (ref 0.450–4.500)

## 2021-11-04 ENCOUNTER — Other Ambulatory Visit: Payer: Self-pay

## 2021-11-04 ENCOUNTER — Ambulatory Visit (HOSPITAL_COMMUNITY)
Admission: RE | Admit: 2021-11-04 | Discharge: 2021-11-04 | Disposition: A | Discharge status: Home / Self Care | Payer: Medicare Other | Source: Ambulatory Visit | Attending: Cardiology | Admitting: Cardiology

## 2021-11-04 DIAGNOSIS — S82841D Displaced bimalleolar fracture of right lower leg, subsequent encounter for closed fracture with routine healing: Secondary | ICD-10-CM | POA: Diagnosis not present

## 2021-11-04 DIAGNOSIS — R600 Localized edema: Secondary | ICD-10-CM | POA: Insufficient documentation

## 2021-11-05 ENCOUNTER — Telehealth: Payer: Self-pay | Admitting: Cardiology

## 2021-11-05 NOTE — Telephone Encounter (Signed)
Pt c/o BP issue: STAT if pt c/o blurred vision, one-sided weakness or slurred speech  1. What are your last 5 BP readings?  7:45 am: 127/74 HR 66 10:10 am: 90/53 HR 65  2. Are you having any other symptoms (ex. Dizziness, headache, blurred vision, passed out)? lightheaded  3. What is your BP issue? Patient was told to report her low BP to the office. She took her medicine at 9:00 today so the second reading was 1 hour post medicine

## 2021-11-05 NOTE — Telephone Encounter (Signed)
Left message for patient with Dr Creshaw's recommendations.   

## 2021-11-05 NOTE — Telephone Encounter (Signed)
Returned call to patient of Dr. Stanford Breed - last seen by Dr. Gardiner Rhyme 10/31/21  She reports her BP has been doing good but today checked BP 1 hour after and was 90/53 She takes carvedilol and lisinopril in the AM She felt dizzy today This is the first time she has noticed a BP this low She was told to call if she has low BP readings  Advised changes to meds may not be made off 1 low BP reading, continue to monitor and if consistent low readings may need to make adjustments. Will notify MD/RN and follow up if additional advice

## 2021-11-11 ENCOUNTER — Telehealth: Payer: Self-pay | Admitting: *Deleted

## 2021-11-11 DIAGNOSIS — S82841D Displaced bimalleolar fracture of right lower leg, subsequent encounter for closed fracture with routine healing: Secondary | ICD-10-CM | POA: Diagnosis not present

## 2021-11-11 NOTE — Telephone Encounter (Signed)
Hello,  This email is to notify you that the patient listed below had an issue where the patch did not activate. We have placed an order for a replacement device to be overnighted to the patient. We have placed a hold on the billing, so the patient is not charged for the first device. If you have any questions, please respond to this email, or call us at [phone].  Patient initials:  Casey Kramer Patient ID: [id number] SN: [id number] Ticket: [id number]   Account: [place]   Thank you, Lester Kinsman Customer Care

## 2021-11-12 DIAGNOSIS — R002 Palpitations: Secondary | ICD-10-CM

## 2021-11-18 ENCOUNTER — Telehealth: Payer: Self-pay | Admitting: *Deleted

## 2021-11-18 DIAGNOSIS — S82841D Displaced bimalleolar fracture of right lower leg, subsequent encounter for closed fracture with routine healing: Secondary | ICD-10-CM | POA: Diagnosis not present

## 2021-11-18 DIAGNOSIS — M25571 Pain in right ankle and joints of right foot: Secondary | ICD-10-CM | POA: Diagnosis not present

## 2021-11-18 DIAGNOSIS — M79671 Pain in right foot: Secondary | ICD-10-CM | POA: Diagnosis not present

## 2021-11-18 MED ORDER — AMLODIPINE BESYLATE 5 MG PO TABS: 5.0000 mg | ORAL_TABLET | Freq: Every day | ORAL | 3 refills | Status: DC

## 2021-11-18 NOTE — Telephone Encounter (Signed)
Spoke with pt, she had sent in her blood pressure readings they ranged from 90-151/59-72 and she is having problems with dizziness after taking her morning medications. Per dr Stanford Breed, patient will decrease the amlodipine to 5 mg daily. She will take 1/2 of the 10 mg tablets she has at home.

## 2021-11-20 ENCOUNTER — Encounter (HOSPITAL_COMMUNITY): Payer: Self-pay | Admitting: *Deleted

## 2021-11-20 ENCOUNTER — Emergency Department (HOSPITAL_COMMUNITY)
Admission: EM | Admit: 2021-11-20 | Discharge: 2021-11-21 | Disposition: A | Discharge status: Home / Self Care | Payer: Medicare Other | Attending: Emergency Medicine | Admitting: Emergency Medicine

## 2021-11-20 ENCOUNTER — Other Ambulatory Visit: Payer: Self-pay

## 2021-11-20 DIAGNOSIS — E039 Hypothyroidism, unspecified: Secondary | ICD-10-CM | POA: Diagnosis not present

## 2021-11-20 DIAGNOSIS — R002 Palpitations: Secondary | ICD-10-CM | POA: Diagnosis not present

## 2021-11-20 DIAGNOSIS — I2510 Atherosclerotic heart disease of native coronary artery without angina pectoris: Secondary | ICD-10-CM | POA: Insufficient documentation

## 2021-11-20 DIAGNOSIS — I10 Essential (primary) hypertension: Secondary | ICD-10-CM | POA: Insufficient documentation

## 2021-11-20 LAB — BASIC METABOLIC PANEL
Anion gap: 11 (ref 5–15)
BUN: 18 mg/dL (ref 8–23)
CO2: 25 mmol/L (ref 22–32)
Calcium: 10.1 mg/dL (ref 8.9–10.3)
Chloride: 102 mmol/L (ref 98–111)
Creatinine, Ser: 0.82 mg/dL (ref 0.44–1.00)
GFR, Estimated: >60 mL/min (ref >60)
Glucose, Bld: 135 mg/dL — ABNORMAL HIGH (ref 70–99)
Potassium: 3.5 mmol/L (ref 3.5–5.1)
Sodium: 138 mmol/L (ref 135–145)

## 2021-11-20 LAB — CBC WITH DIFFERENTIAL/PLATELET
Abs Immature Granulocytes: 0.02 K/uL (ref 0.00–0.07)
Basophils Absolute: 0.1 K/uL (ref 0.0–0.1)
Basophils Relative: 1 %
Eosinophils Absolute: 0.2 K/uL (ref 0.0–0.5)
Eosinophils Relative: 3 %
HCT: 44 % (ref 36.0–46.0)
Hemoglobin: 14.2 g/dL (ref 12.0–15.0)
Immature Granulocytes: 0 %
Lymphocytes Relative: 27 %
Lymphs Abs: 2 K/uL (ref 0.7–4.0)
MCH: 28 pg (ref 26.0–34.0)
MCHC: 32.3 g/dL (ref 30.0–36.0)
MCV: 86.8 fL (ref 80.0–100.0)
Monocytes Absolute: 0.5 K/uL (ref 0.1–1.0)
Monocytes Relative: 7 %
Neutro Abs: 4.7 K/uL (ref 1.7–7.7)
Neutrophils Relative %: 62 %
Platelets: 271 K/uL (ref 150–400)
RBC: 5.07 MIL/uL (ref 3.87–5.11)
RDW: 14.2 % (ref 11.5–15.5)
WBC: 7.5 K/uL (ref 4.0–10.5)
nRBC: 0 % (ref 0.0–0.2)

## 2021-11-20 LAB — TROPONIN I (HIGH SENSITIVITY)
Troponin I (High Sensitivity): 3 ng/L (ref <18)
Troponin I (High Sensitivity): 4 ng/L (ref <18)

## 2021-11-20 NOTE — ED Triage Notes (Signed)
Pt with palpitations, had a cardiac monitor recently.  Used an app husband's phone and it stated possible AFIB.  Denies any SOB.

## 2021-11-21 ENCOUNTER — Telehealth: Payer: Self-pay | Admitting: Cardiology

## 2021-11-21 DIAGNOSIS — R002 Palpitations: Secondary | ICD-10-CM | POA: Diagnosis not present

## 2021-11-21 NOTE — ED Notes (Signed)
Patient assisted to the bathroom using a wheel chair.

## 2021-11-21 NOTE — Telephone Encounter (Signed)
Patient is calling to let Dr. Stanford Breed know that she was in the hospital last night for palpitations. She wants to make sure he was aware of that.  She is also calling because she took her BP medication so late last night, she took it at 1:30am.  Her BP this morning was 102/61, she hasn't taken her BP medication this medication because she is worried it will go lower than what it is now.  She is wondering if she should take it.

## 2021-11-21 NOTE — Discharge Instructions (Signed)
You were evaluated in the Emergency Department and after careful evaluation, we did not find any emergent condition requiring admission or further testing in the hospital.  Your exam/testing today is overall reassuring.  Recommend follow-up with your cardiologist to discuss your symptoms.  Please return to the Emergency Department if you experience any worsening of your condition.   Thank you for allowing us to be a part of your care. 

## 2021-11-21 NOTE — Telephone Encounter (Signed)
Spoke with patient who reports going to the ED last night for palpitations. Her K[employer]ia[employer] device showed afib. She is concerned because she took her bp meds around 1am and her bp this morning is 102/61. She will hold the lisinopril and carvedilol this morning and monitor her bp at noon and 3pm. She will take her evening doses at 6pm instead of 8pm today and get back on her schedule tomorrow. She also reports the palpitations are constant and stated she is being evaluated for her thyroid. Her dose has increased to 100 mcg daily. She will contact us if her blood pressure "shoots up" today.

## 2021-11-21 NOTE — ED Provider Notes (Signed)
[facility] Emergency Department Provider Note MRN:  [id number]  [place] date & time: 11/21/21     Chief Complaint   Palpitations   History of Present Illness   Casey Kramer is a 71 y.o. year-old female with a history of hypertension, CAD presenting to the ED with chief complaint of palpitations.  Patient explains that she has fairly chronic palpitations managed by cardiology.  Happened again this evening but were a bit different.  Described more as a pressure with some radiation into the anterior throat/neck.  Her Apple Watch told her that she had A-fib and so she is here for evaluation.  She is still having on and off symptoms at this time.  Denies any shortness of breath, no dizziness or diaphoresis, no nausea or vomiting, no abdominal pain, no other complaints.  Review of Systems  A thorough review of systems was obtained and all systems are negative except as noted in the HPI and PMH.   Patient's Health History    Past Medical History:  Diagnosis Date   CAD (coronary artery disease)    Hyperlipidemia    Hypertension    Hypothyroid     Past Surgical History:  Procedure Laterality Date   laproscopic endometrial surgrey     ORIF ANKLE FRACTURE Right 07/17/2021   Procedure: OPEN REDUCTION INTERNAL FIXATION (ORIF) right ankle bimalleolar fracture;  Surgeon: Wylene Simmer, MD;  Location: [place] [facility];  Service: Orthopedics;  Laterality: Right;  7min    Family History  Problem Relation Age of Onset   Heart attack Mother    Heart disease Mother    Stroke Mother    Diabetes Father    Heart failure Father    Breast cancer Neg Hx     Social History   Socioeconomic History   Marital status: Married    Spouse name: Not on file   Number of children: 2   Years of education: Not on file   Highest education level: Not on file  Occupational History   Not on file  Tobacco Use   Smoking status: Never   Smokeless tobacco: Never  Vaping  Use   Vaping Use: Never used  Substance and Sexual Activity   Alcohol use: No   Drug use: Never   Sexual activity: Not on file  Other Topics Concern   Not on file  Social History Narrative   Not on file   Social Determinants of Health   Financial Resource Strain: Not on file  Food Insecurity: Not on file  Transportation Needs: Not on file  Physical Activity: Not on file  Stress: Not on file  Social Connections: Not on file  Intimate Partner Violence: Not on file     Physical Exam   Vitals:   11/20/21 1937 11/21/21 0030  BP: (!) 164/89 (!) 154/92  Pulse: 82 78  Resp: 20 20  Temp: 98.1 F (36.7 C)   SpO2: 98% 99%    CONSTITUTIONAL: Well-appearing, NAD NEURO/PSYCH:  Alert and oriented x 3, no focal deficits EYES:  eyes equal and reactive ENT/NECK:  no LAD, no JVD CARDIO: Regular rate, well-perfused, normal S1 and S2 PULM:  CTAB no wheezing or rhonchi GI/GU:  non-distended, non-tender MSK/SPINE:  No gross deformities, no edema SKIN:  no rash, atraumatic   *Additional and/or pertinent findings included in MDM below  Diagnostic and Interventional Summary    EKG Interpretation  Date/Time:  Thursday November 20 2021 19:41:05 EST Ventricular Rate:  83 PR Interval:  158 QRS Duration: 78 QT Interval:  368 QTC Calculation: 432 R Axis:   -30 Text Interpretation: Normal sinus rhythm Left axis deviation Abnormal ECG When compared with ECG of 21-Aug-2021 21:03, No significant change was found Confirmed by Gerlene Fee [phone]) on 11/20/2021 11:01:47 PM       Labs Reviewed  BASIC METABOLIC PANEL - Abnormal; Notable for the following components:      Result Value   Glucose, Bld 135 (*)    All other components within normal limits  CBC WITH DIFFERENTIAL/PLATELET  TROPONIN I (HIGH SENSITIVITY)  TROPONIN I (HIGH SENSITIVITY)    No orders to display    Medications - No data to display   Procedures  /  Critical Care Procedures  ED Course and Medical Decision  Making  Initial Impression and Ddx Palpitations of unclear cause.  I reviewed her rhythm strip on her iPhone, I see P waves and so I doubt A-fib.  I see frequent PVCs which may explain her symptoms.  Twelve-lead EKG here is sinus rhythm.  Labs reassuring, troponin negative x2, highly doubt ACS or PE.  Will observe on the cardiac monitor for period of time, anticipating discharge.  With no obvious or definitive signs of A-fib, anticoagulation not indicated at this time.  She has close follow-up with cardiology.  Past medical/surgical history that increases complexity of ED encounter: History of chronic palpitations  Interpretation of Diagnostics I personally reviewed the EKG and my interpretation is as follows: Sinus rhythm, no ischemic findings, no arrhythmia    Labs reassuring with troponin negative x2  Patient Reassessment and Ultimate Disposition/Management Patient monitored and is not exhibiting any ectopy or arrhythmia, appropriate for discharge.  Patient management required discussion with the following services or consulting groups:  None  Complexity of Problems Addressed Chronic illness with exacerbation  Additional Data Reviewed and Analyzed Further history obtained from: Recent Consult notes  Factors Impacting ED Encounter Risk None  Barth Kirks. [place] Small, [place] [email or web address]  Final Clinical Impressions(s) / ED Diagnoses     ICD-10-CM   1. Palpitations  R00.2       ED Discharge Orders     None        Discharge Instructions Discussed with and Provided to Patient:     Discharge Instructions      You were evaluated in the Emergency Department and after careful evaluation, we did not find any emergent condition requiring admission or further testing in the hospital.  Your exam/testing today is overall reassuring.  Recommend follow-up with your cardiologist to discuss your symptoms.  Please return to  the Emergency Department if you experience any worsening of your condition.   Thank you for allowing [place] to be a part of your care.       Maudie Flakes, MD 11/21/21 [phone]

## 2021-11-25 DIAGNOSIS — E785 Hyperlipidemia, unspecified: Secondary | ICD-10-CM | POA: Diagnosis not present

## 2021-11-25 DIAGNOSIS — R002 Palpitations: Secondary | ICD-10-CM | POA: Diagnosis not present

## 2021-11-25 DIAGNOSIS — I10 Essential (primary) hypertension: Secondary | ICD-10-CM | POA: Diagnosis not present

## 2021-11-25 DIAGNOSIS — S82841D Displaced bimalleolar fracture of right lower leg, subsequent encounter for closed fracture with routine healing: Secondary | ICD-10-CM | POA: Diagnosis not present

## 2021-11-25 DIAGNOSIS — E039 Hypothyroidism, unspecified: Secondary | ICD-10-CM | POA: Diagnosis not present

## 2021-11-28 ENCOUNTER — Other Ambulatory Visit: Payer: Self-pay | Admitting: Cardiology

## 2021-12-01 DIAGNOSIS — S82841D Displaced bimalleolar fracture of right lower leg, subsequent encounter for closed fracture with routine healing: Secondary | ICD-10-CM | POA: Diagnosis not present

## 2021-12-01 DIAGNOSIS — M25571 Pain in right ankle and joints of right foot: Secondary | ICD-10-CM | POA: Diagnosis not present

## 2021-12-02 DIAGNOSIS — S82841D Displaced bimalleolar fracture of right lower leg, subsequent encounter for closed fracture with routine healing: Secondary | ICD-10-CM | POA: Diagnosis not present

## 2021-12-03 NOTE — Progress Notes (Signed)
HPI: FU CAD. She was noted to have coronary calcification on prior CT scan. Last nuclear study July 2018 showed ejection fraction 73%. There was diaphragmatic attenuation but no ischemia. Echocardiogram August 2020 showed normal LV function, grade 2 diastolic dysfunction.  Venous Dopplers January 2023 showed no DVT.  Patient seen in the emergency room February 2023 with palpitations.  She was found to be in sinus rhythm.  Monitor February 2023 showed sinus rhythm with short runs of PAT, PACs and no PVCs.  Since last seen, she did have palpitations previously but this is improved with higher dose Synthroid.  She denies dyspnea, chest pain or syncope.  Current Outpatient Medications  Medication Sig Dispense Refill   amLODipine (NORVASC) 5 MG tablet Take 1 tablet (5 mg total) by mouth daily. 90 tablet 3   aspirin 81 MG EC tablet take one a day     carvedilol (COREG) 12.5 MG tablet TAKE 1 TABLET (12.5 MG TOTAL) BY MOUTH 2 (TWO) TIMES DAILY. 180 tablet 3   Cholecalciferol (VITAMIN D-1000 MAX ST PO)      levothyroxine (SYNTHROID) 88 MCG tablet Take 88 mcg by mouth daily before breakfast.     lisinopril (ZESTRIL) 40 MG tablet TAKE 1 TABLET BY MOUTH EVERY DAY 30 tablet 3   meclizine (ANTIVERT) 25 MG tablet Take 1 tablet (25 mg total) by mouth 4 (four) times daily as needed for dizziness. 30 tablet 0   rosuvastatin (CRESTOR) 5 MG tablet TAKE 1 TABLET BY MOUTH 3 (THREE) TIMES A WEEK. 36 tablet 3   No current facility-administered medications for this visit.     Past Medical History:  Diagnosis Date   CAD (coronary artery disease)    Hyperlipidemia    Hypertension    Hypothyroid     Past Surgical History:  Procedure Laterality Date   laproscopic endometrial surgrey     ORIF ANKLE FRACTURE Right 07/17/2021   Procedure: OPEN REDUCTION INTERNAL FIXATION (ORIF) right ankle bimalleolar fracture;  Surgeon: Wylene Simmer, MD;  Location: [place];  Service: Orthopedics;   Laterality: Right;  80min    Social History   Socioeconomic History   Marital status: Married    Spouse name: Not on file   Number of children: 2   Years of education: Not on file   Highest education level: Not on file  Occupational History   Not on file  Tobacco Use   Smoking status: Never   Smokeless tobacco: Never  Vaping Use   Vaping Use: Never used  Substance and Sexual Activity   Alcohol use: No   Drug use: Never   Sexual activity: Not on file  Other Topics Concern   Not on file  Social History Narrative   Not on file   Social Determinants of Health   Financial Resource Strain: Not on file  Food Insecurity: Not on file  Transportation Needs: Not on file  Physical Activity: Not on file  Stress: Not on file  Social Connections: Not on file  Intimate Partner Violence: Not on file    Family History  Problem Relation Age of Onset   Heart attack Mother    Heart disease Mother    Stroke Mother    Diabetes Father    Heart failure Father    Breast cancer Neg Hx     ROS: Residual pain from recent ankle fracture but no fevers or chills, productive cough, hemoptysis, dysphasia, odynophagia, melena, hematochezia, dysuria, hematuria, rash, seizure activity, orthopnea,  PND, pedal edema, claudication. Remaining systems are negative.  Physical Exam: Well-developed well-nourished in no acute distress.  Skin is warm and dry.  HEENT is normal.  Neck is supple.  Chest is clear to auscultation with normal expansion.  Cardiovascular exam is regular rate and rhythm.  Abdominal exam nontender or distended. No masses palpated. Extremities show no edema.  Right ankle dressing in place neuro grossly intact  A/P  1 coronary artery disease-patient denies chest pain.  Continue medical therapy with aspirin and statin.  Previous diagnosis based on CT scan demonstrating coronary calcification.  2 hypertension-blood pressure controlled.  However she is describing some dizziness  after taking her a.m. medications.  I will decrease lisinopril to 20 mg daily and continue carvedilol and amlodipine at present dose.  She will check her blood pressure at home particularly when she feels lightheaded and we will adjust as needed.  3 palpitations-no significant arrhythmias documented today.  She will continue with AliveCor at home.  Continue beta-blocker.  4 hyperlipidemia-continue low-dose Crestor.  She did not tolerate higher doses previously.  She also states that Repatha was too expensive.  Kirk Ruths, MD

## 2021-12-04 DIAGNOSIS — Z20822 Contact with and (suspected) exposure to covid-19: Secondary | ICD-10-CM | POA: Diagnosis not present

## 2021-12-09 ENCOUNTER — Ambulatory Visit (INDEPENDENT_AMBULATORY_CARE_PROVIDER_SITE_OTHER): Payer: Medicare Other | Admitting: Cardiology

## 2021-12-09 ENCOUNTER — Encounter: Payer: Self-pay | Admitting: Cardiology

## 2021-12-09 ENCOUNTER — Other Ambulatory Visit: Payer: Self-pay

## 2021-12-09 VITALS — BP 136/86 | HR 62 | Ht 62.0 in | Wt 149.4 lb

## 2021-12-09 DIAGNOSIS — R002 Palpitations: Secondary | ICD-10-CM

## 2021-12-09 DIAGNOSIS — I2510 Atherosclerotic heart disease of native coronary artery without angina pectoris: Secondary | ICD-10-CM

## 2021-12-09 DIAGNOSIS — I10 Essential (primary) hypertension: Secondary | ICD-10-CM | POA: Diagnosis not present

## 2021-12-09 DIAGNOSIS — E785 Hyperlipidemia, unspecified: Secondary | ICD-10-CM | POA: Diagnosis not present

## 2021-12-09 DIAGNOSIS — S82841D Displaced bimalleolar fracture of right lower leg, subsequent encounter for closed fracture with routine healing: Secondary | ICD-10-CM | POA: Diagnosis not present

## 2021-12-09 DIAGNOSIS — R42 Dizziness and giddiness: Secondary | ICD-10-CM

## 2021-12-09 MED ORDER — LISINOPRIL 20 MG PO TABS: 20.0000 mg | ORAL_TABLET | Freq: Every day | ORAL | 3 refills | Status: DC

## 2021-12-09 NOTE — Patient Instructions (Signed)
Medication Instructions:   DECREASE LISINOPRIL TO 20 MG ONCE DAILY= 1/2 OF THE 20 MG TABLET ONCE DAILY  *If you need a refill on your cardiac medications before your next appointment, please call your pharmacy*   Follow-Up: At [facility], you and your health needs are our priority.  As part of our continuing mission to provide you with exceptional heart care, we have created designated Provider Care Teams.  These Care Teams include your primary Cardiologist (physician) and Advanced Practice Providers (APPs -  Physician Assistants and Nurse Practitioners) who all work together to provide you with the care you need, when you need it.  We recommend signing up for the patient portal called "MyChart".  Sign up information is provided on this After Visit Summary.  MyChart is used to connect with patients for Virtual Visits (Telemedicine).  Patients are able to view lab/test results, encounter notes, upcoming appointments, etc.  Non-urgent messages can be sent to your provider as well.   To learn more about what you can do with MyChart, go to [email or web address].    Your next appointment:   3 month(s)  The format for your next appointment:   In Person  Provider:   Kirk Ruths, MD

## 2021-12-15 ENCOUNTER — Telehealth: Payer: Self-pay | Admitting: Cardiology

## 2021-12-15 NOTE — Telephone Encounter (Signed)
Ok to hold lisinopril to see if dizziness improves and BP stays controlled. If BP increases, can resume at '10mg'$  daily. ?

## 2021-12-15 NOTE — Telephone Encounter (Signed)
Pt c/o medication issue: ? ?1. Name of Medication: carvedilol (COREG) 12.5 MG tablet ? ?2. How are you currently taking this medication (dosage and times per day)? TAKE 1 TABLET (12.5 MG TOTAL) BY MOUTH 2 (TWO) TIMES DAILY. ? ?3. Are you having a reaction (difficulty breathing--STAT)? No  ? ?4. What is your medication issue? Patient says that everytime after she takes carvedilol (COREG) 12.5 MG tablet, her blood pressure gets really low. She takes it before she takes the other blood pressure medication she on. Patient wants to know what to do ? ? ?

## 2021-12-15 NOTE — Telephone Encounter (Signed)
Patient aware of recommendations and verbalized understanding.  She will continue to monitor BP and call in 2 weeks with update.  Advised to call back sooner if needed.  ?

## 2021-12-15 NOTE — Telephone Encounter (Signed)
Returned call to patient, patient reports concerns with low BP and ongoing dizziness/lightheadedness.  ? ?Current meds   ?AM: coreg 12.5 + lisinopril 20 ?PM: coreg 12.5 + amlodipine 5 ? ?She reports she has been spacing the medications out and checking BP before taking the lisinopril or amlodipine because she continues to be lightheaded.   BP 1 hour after taking coreg AM or PM: 121/60, 119/70, 122/59 HR 60-70s.   She states Friday night she took her BP after her taking her coreg and it was 113/64, she held the amlodipine.  The next AM it was 138/73 prior to taking her AM dose of medication.  She states she is nervous to take her lisinopril and amlodipine as she doesn't want to drop her BP any lower.   Advised would route to MD and pharmD to review. ?

## 2021-12-20 DIAGNOSIS — Z20822 Contact with and (suspected) exposure to covid-19: Secondary | ICD-10-CM | POA: Diagnosis not present

## 2021-12-24 ENCOUNTER — Telehealth: Payer: Self-pay | Admitting: Cardiology

## 2021-12-24 NOTE — Telephone Encounter (Signed)
Spoke with pt, she is very anxious about her blood pressure. Her blood pressure this morning after taking her medications was 129/66 hen she reports she got hot and knew her blood pressure was elevated and it was 161/87. Explained to the patient she will have fluctuations in her blood pressure. She will try taking the amlodipine midday and take the carvedilol twice daily to see if the amlodipine in the middle of the day will help with spikes.Pt agreed with this plan and will let us know if she has any other problems. ?

## 2021-12-24 NOTE — Telephone Encounter (Signed)
Pt c/o BP issue: STAT if pt c/o blurred vision, one-sided weakness or slurred speech ? ?1. What are your last 5 BP readings? 137/77, 142/77, 151/82, 137/76, 137/72, 161/87 ? ?2. Are you having any other symptoms (ex. Dizziness, headache, blurred vision, passed out)? Nausea, face is flushed, headache ? ?3. What is your BP issue? Pt states that her blood pressure is fluctuating and would like to know if she should make any changes in her medicine... please advise  ? ?

## 2022-01-19 DIAGNOSIS — L600 Ingrowing nail: Secondary | ICD-10-CM | POA: Diagnosis not present

## 2022-01-20 DIAGNOSIS — Z20822 Contact with and (suspected) exposure to covid-19: Secondary | ICD-10-CM | POA: Diagnosis not present

## 2022-01-22 DIAGNOSIS — E785 Hyperlipidemia, unspecified: Secondary | ICD-10-CM | POA: Diagnosis not present

## 2022-01-22 DIAGNOSIS — E039 Hypothyroidism, unspecified: Secondary | ICD-10-CM | POA: Diagnosis not present

## 2022-01-26 DIAGNOSIS — Z20822 Contact with and (suspected) exposure to covid-19: Secondary | ICD-10-CM | POA: Diagnosis not present

## 2022-01-29 DIAGNOSIS — S82841D Displaced bimalleolar fracture of right lower leg, subsequent encounter for closed fracture with routine healing: Secondary | ICD-10-CM | POA: Diagnosis not present

## 2022-02-05 DIAGNOSIS — E039 Hypothyroidism, unspecified: Secondary | ICD-10-CM | POA: Diagnosis not present

## 2022-02-05 DIAGNOSIS — I10 Essential (primary) hypertension: Secondary | ICD-10-CM | POA: Diagnosis not present

## 2022-02-05 DIAGNOSIS — R519 Headache, unspecified: Secondary | ICD-10-CM | POA: Diagnosis not present

## 2022-02-05 DIAGNOSIS — D329 Benign neoplasm of meninges, unspecified: Secondary | ICD-10-CM | POA: Diagnosis not present

## 2022-02-13 DIAGNOSIS — Z20822 Contact with and (suspected) exposure to covid-19: Secondary | ICD-10-CM | POA: Diagnosis not present

## 2022-02-16 DIAGNOSIS — Z20822 Contact with and (suspected) exposure to covid-19: Secondary | ICD-10-CM | POA: Diagnosis not present

## 2022-02-17 ENCOUNTER — Other Ambulatory Visit: Payer: Self-pay | Admitting: Internal Medicine

## 2022-02-17 DIAGNOSIS — Z1231 Encounter for screening mammogram for malignant neoplasm of breast: Secondary | ICD-10-CM

## 2022-02-19 DIAGNOSIS — N898 Other specified noninflammatory disorders of vagina: Secondary | ICD-10-CM | POA: Diagnosis not present

## 2022-02-19 DIAGNOSIS — R300 Dysuria: Secondary | ICD-10-CM | POA: Diagnosis not present

## 2022-02-25 NOTE — Progress Notes (Signed)
HPI: FU CAD. She was noted to have coronary calcification on prior CT scan. Last nuclear study July 2018 showed ejection fraction 73%. There was diaphragmatic attenuation but no ischemia. Echocardiogram August 2020 showed normal LV function, grade 2 diastolic dysfunction.  Venous Dopplers January 2023 showed no DVT.  Patient seen in the emergency room February 2023 with palpitations.  She was found to be in sinus rhythm.  Monitor February 2023 showed sinus rhythm with short runs of PAT, PACs and no PVCs.  Since last seen, she has occasional palpitations but improved.  She denies dyspnea, chest pain or syncope.  Current Outpatient Medications  Medication Sig Dispense Refill   amLODipine (NORVASC) 5 MG tablet Take 1 tablet (5 mg total) by mouth daily. 90 tablet 3   aspirin 81 MG EC tablet take one a day     carvedilol (COREG) 12.5 MG tablet TAKE 1 TABLET (12.5 MG TOTAL) BY MOUTH 2 (TWO) TIMES DAILY. 180 tablet 3   Cholecalciferol (VITAMIN D-1000 MAX ST PO)      levothyroxine (SYNTHROID) 88 MCG tablet Take 88 mcg by mouth daily before breakfast.     lisinopril (ZESTRIL) 20 MG tablet Take 1 tablet (20 mg total) by mouth daily. 90 tablet 3   meclizine (ANTIVERT) 25 MG tablet Take 1 tablet (25 mg total) by mouth 4 (four) times daily as needed for dizziness. 30 tablet 0   rosuvastatin (CRESTOR) 5 MG tablet TAKE 1 TABLET BY MOUTH 3 (THREE) TIMES A WEEK. 36 tablet 3   No current facility-administered medications for this visit.     Past Medical History:  Diagnosis Date   CAD (coronary artery disease)    Hyperlipidemia    Hypertension    Hypothyroid     Past Surgical History:  Procedure Laterality Date   laproscopic endometrial surgrey     ORIF ANKLE FRACTURE Right 07/17/2021   Procedure: OPEN REDUCTION INTERNAL FIXATION (ORIF) right ankle bimalleolar fracture;  Surgeon: Wylene Simmer, MD;  Location: [facility];  Service: Orthopedics;  Laterality: Right;  14mn    Social  History   Socioeconomic History   Marital status: Married    Spouse name: Not on file   Number of children: 2   Years of education: Not on file   Highest education level: Not on file  Occupational History   Not on file  Tobacco Use   Smoking status: Never   Smokeless tobacco: Never  Vaping Use   Vaping Use: Never used  Substance and Sexual Activity   Alcohol use: No   Drug use: Never   Sexual activity: Not on file  Other Topics Concern   Not on file  Social History Narrative   Not on file   Social Determinants of Health   Financial Resource Strain: Not on file  Food Insecurity: Not on file  Transportation Needs: Not on file  Physical Activity: Not on file  Stress: Not on file  Social Connections: Not on file  Intimate Partner Violence: Not on file    Family History  Problem Relation Age of Onset   Heart attack Mother    Heart disease Mother    Stroke Mother    Diabetes Father    Heart failure Father    Breast cancer Neg Hx     ROS: no fevers or chills, productive cough, hemoptysis, dysphasia, odynophagia, melena, hematochezia, dysuria, hematuria, rash, seizure activity, orthopnea, PND, pedal edema, claudication. Remaining systems are negative.  Physical Exam: Well-developed  well-nourished in no acute distress.  Skin is warm and dry.  HEENT is normal.  Neck is supple.  Chest is clear to auscultation with normal expansion.  Cardiovascular exam is regular rate and rhythm.  Abdominal exam nontender or distended. No masses palpated. Extremities show no edema. neuro grossly intact   A/P  1 coronary artery disease-based on previous CT scan showing coronary calcification.  Patient doing well with no chest pain.  Continue aspirin and statin.  2 hypertension-blood pressure borderline today but she follows this at home and it is typically controlled.  Continue present medications and follow-up.  3 palpitations-continue beta-blocker.   4 hyperlipidemia-continue  low-dose Crestor as she did not tolerate higher doses previously.  She previously declined Repatha due to expense.  Kirk Ruths, MD

## 2022-03-05 ENCOUNTER — Ambulatory Visit (INDEPENDENT_AMBULATORY_CARE_PROVIDER_SITE_OTHER): Payer: Medicare Other | Admitting: Cardiology

## 2022-03-05 ENCOUNTER — Encounter: Payer: Self-pay | Admitting: Cardiology

## 2022-03-05 VITALS — BP 135/90 | HR 61 | Ht 61.0 in | Wt 150.2 lb

## 2022-03-05 DIAGNOSIS — R002 Palpitations: Secondary | ICD-10-CM | POA: Diagnosis not present

## 2022-03-05 DIAGNOSIS — E785 Hyperlipidemia, unspecified: Secondary | ICD-10-CM | POA: Diagnosis not present

## 2022-03-05 DIAGNOSIS — I10 Essential (primary) hypertension: Secondary | ICD-10-CM | POA: Diagnosis not present

## 2022-03-05 DIAGNOSIS — I2510 Atherosclerotic heart disease of native coronary artery without angina pectoris: Secondary | ICD-10-CM | POA: Diagnosis not present

## 2022-03-05 MED ORDER — CARVEDILOL 12.5 MG PO TABS: 12.5000 mg | ORAL_TABLET | Freq: Two times a day (BID) | ORAL | 3 refills | Status: DC

## 2022-03-05 NOTE — Patient Instructions (Signed)

## 2022-03-05 NOTE — Addendum Note (Signed)
Addended by: Cristopher Estimable on: 03/05/2022 11:32 AM   Modules accepted: Orders

## 2022-03-07 ENCOUNTER — Other Ambulatory Visit: Payer: Self-pay | Admitting: Cardiology

## 2022-03-19 DIAGNOSIS — L280 Lichen simplex chronicus: Secondary | ICD-10-CM | POA: Diagnosis not present

## 2022-03-19 DIAGNOSIS — R300 Dysuria: Secondary | ICD-10-CM | POA: Diagnosis not present

## 2022-03-19 DIAGNOSIS — N952 Postmenopausal atrophic vaginitis: Secondary | ICD-10-CM | POA: Diagnosis not present

## 2022-03-19 DIAGNOSIS — N9089 Other specified noninflammatory disorders of vulva and perineum: Secondary | ICD-10-CM | POA: Diagnosis not present

## 2022-03-26 ENCOUNTER — Ambulatory Visit
Admission: RE | Admit: 2022-03-26 | Discharge: 2022-03-26 | Disposition: A | Discharge status: Home / Self Care | Payer: Medicare Other | Source: Ambulatory Visit | Attending: Internal Medicine | Admitting: Internal Medicine

## 2022-03-26 DIAGNOSIS — Z1231 Encounter for screening mammogram for malignant neoplasm of breast: Secondary | ICD-10-CM

## 2022-03-30 ENCOUNTER — Other Ambulatory Visit: Payer: Self-pay | Admitting: Internal Medicine

## 2022-03-30 DIAGNOSIS — R928 Other abnormal and inconclusive findings on diagnostic imaging of breast: Secondary | ICD-10-CM

## 2022-04-01 ENCOUNTER — Ambulatory Visit
Admission: RE | Admit: 2022-04-01 | Discharge: 2022-04-01 | Disposition: A | Discharge status: Home / Self Care | Payer: Medicare Other | Source: Ambulatory Visit | Attending: Internal Medicine | Admitting: Internal Medicine

## 2022-04-01 ENCOUNTER — Ambulatory Visit: Payer: Medicare Other

## 2022-04-01 DIAGNOSIS — R928 Other abnormal and inconclusive findings on diagnostic imaging of breast: Secondary | ICD-10-CM | POA: Diagnosis not present

## 2022-04-02 DIAGNOSIS — N9089 Other specified noninflammatory disorders of vulva and perineum: Secondary | ICD-10-CM | POA: Diagnosis not present

## 2022-05-04 DIAGNOSIS — E039 Hypothyroidism, unspecified: Secondary | ICD-10-CM | POA: Diagnosis not present

## 2022-05-04 DIAGNOSIS — I10 Essential (primary) hypertension: Secondary | ICD-10-CM | POA: Diagnosis not present

## 2022-05-04 DIAGNOSIS — R7989 Other specified abnormal findings of blood chemistry: Secondary | ICD-10-CM | POA: Diagnosis not present

## 2022-05-11 DIAGNOSIS — E785 Hyperlipidemia, unspecified: Secondary | ICD-10-CM | POA: Diagnosis not present

## 2022-05-11 DIAGNOSIS — I10 Essential (primary) hypertension: Secondary | ICD-10-CM | POA: Diagnosis not present

## 2022-05-11 DIAGNOSIS — M5416 Radiculopathy, lumbar region: Secondary | ICD-10-CM | POA: Diagnosis not present

## 2022-05-11 DIAGNOSIS — R002 Palpitations: Secondary | ICD-10-CM | POA: Diagnosis not present

## 2022-05-11 DIAGNOSIS — E039 Hypothyroidism, unspecified: Secondary | ICD-10-CM | POA: Diagnosis not present

## 2022-05-11 DIAGNOSIS — Z0000 Encounter for general adult medical examination without abnormal findings: Secondary | ICD-10-CM | POA: Diagnosis not present

## 2022-05-11 DIAGNOSIS — R82998 Other abnormal findings in urine: Secondary | ICD-10-CM | POA: Diagnosis not present

## 2022-05-11 DIAGNOSIS — Z1331 Encounter for screening for depression: Secondary | ICD-10-CM | POA: Diagnosis not present

## 2022-05-11 DIAGNOSIS — E663 Overweight: Secondary | ICD-10-CM | POA: Diagnosis not present

## 2022-05-11 DIAGNOSIS — Z1339 Encounter for screening examination for other mental health and behavioral disorders: Secondary | ICD-10-CM | POA: Diagnosis not present

## 2022-06-22 DIAGNOSIS — S82841D Displaced bimalleolar fracture of right lower leg, subsequent encounter for closed fracture with routine healing: Secondary | ICD-10-CM | POA: Diagnosis not present

## 2022-07-06 DIAGNOSIS — N9089 Other specified noninflammatory disorders of vulva and perineum: Secondary | ICD-10-CM | POA: Diagnosis not present

## 2022-07-06 DIAGNOSIS — R300 Dysuria: Secondary | ICD-10-CM | POA: Diagnosis not present

## 2022-08-12 DIAGNOSIS — Z23 Encounter for immunization: Secondary | ICD-10-CM | POA: Diagnosis not present

## 2022-08-27 NOTE — Progress Notes (Signed)
HPI: FU CAD. She was noted to have coronary calcification on prior CT scan. Last nuclear study July 2018 showed ejection fraction 73%. There was diaphragmatic attenuation but no ischemia. Echocardiogram August 2020 showed normal LV function, grade 2 diastolic dysfunction.  Venous Dopplers January 2023 showed no DVT.  Patient seen in the emergency room February 2023 with palpitations.  She was found to be in sinus rhythm.  Monitor February 2023 showed sinus rhythm with short runs of PAT, PACs and no PVCs.  Since last seen, she denies exertional chest pain, dyspnea, palpitations or syncope.  She has noticed that her blood pressure runs low after taking her amlodipine.  Occasional dizziness with standing.  Current Outpatient Medications  Medication Sig Dispense Refill   aspirin 81 MG EC tablet take one a day     carvedilol (COREG) 12.5 MG tablet Take 1 tablet (12.5 mg total) by mouth 2 (two) times daily. 180 tablet 3   Cholecalciferol (VITAMIN D-1000 MAX ST PO)      estradiol (ESTRACE) 0.1 MG/GM vaginal cream APPLY SMALL AMOUNT TO AFFECTED AREA AT BEDTIME X 7 DAYS, THEN 2-3X/WK     levothyroxine (SYNTHROID) 112 MCG tablet Take 112 mcg by mouth every morning.     meclizine (ANTIVERT) 25 MG tablet Take 1 tablet (25 mg total) by mouth 4 (four) times daily as needed for dizziness. 30 tablet 0   rosuvastatin (CRESTOR) 5 MG tablet TAKE 1 TABLET BY MOUTH 3 (THREE) TIMES A WEEK. 36 tablet 3   amLODipine (NORVASC) 5 MG tablet Take 1 tablet (5 mg total) by mouth daily. 90 tablet 3   No current facility-administered medications for this visit.     Past Medical History:  Diagnosis Date   CAD (coronary artery disease)    Hyperlipidemia    Hypertension    Hypothyroid     Past Surgical History:  Procedure Laterality Date   laproscopic endometrial surgrey     ORIF ANKLE FRACTURE Right 07/17/2021   Procedure: OPEN REDUCTION INTERNAL FIXATION (ORIF) right ankle bimalleolar fracture;  Surgeon: Wylene Simmer, MD;  Location: [facility];  Service: Orthopedics;  Laterality: Right;  55mn    Social History   Socioeconomic History   Marital status: Married    Spouse name: Not on file   Number of children: 2   Years of education: Not on file   Highest education level: Not on file  Occupational History   Not on file  Tobacco Use   Smoking status: Never   Smokeless tobacco: Never  Vaping Use   Vaping Use: Never used  Substance and Sexual Activity   Alcohol use: No   Drug use: Never   Sexual activity: Not on file  Other Topics Concern   Not on file  Social History Narrative   Not on file   Social Determinants of Health   Financial Resource Strain: Not on file  Food Insecurity: Not on file  Transportation Needs: Not on file  Physical Activity: Not on file  Stress: Not on file  Social Connections: Not on file  Intimate Partner Violence: Not on file    Family History  Problem Relation Age of Onset   Heart attack Mother    Heart disease Mother    Stroke Mother    Diabetes Father    Heart failure Father    Breast cancer Neg Hx     ROS: no fevers or chills, productive cough, hemoptysis, dysphasia, odynophagia, melena, hematochezia, dysuria, hematuria,  rash, seizure activity, orthopnea, PND, pedal edema, claudication. Remaining systems are negative.  Physical Exam: Well-developed well-nourished in no acute distress.  Skin is warm and dry.  HEENT is normal.  Neck is supple.  Chest is clear to auscultation with normal expansion.  Cardiovascular exam is regular rate and rhythm.  Abdominal exam nontender or distended. No masses palpated. Extremities show no edema. neuro grossly intact   A/P  1 coronary artery disease-based on history of coronary calcification.  Patient asymptomatic.  Continue medical therapy including aspirin and statin.  2 hyperlipidemia-continue low-dose Crestor as she did not tolerate higher doses in the past.  She declined PCSK9  inhibitor previously.  Check lipids and liver.  Will also check TSH.  3 hypertension-patient states her blood pressure is running low at home.  We will hold her amlodipine and follow.  Goal systolic blood pressure 208 or less and diastolic 85 or less.  4 palpitations-continue beta-blocker.  Kirk Ruths, MD

## 2022-09-10 ENCOUNTER — Ambulatory Visit: Payer: Medicare Other | Attending: Cardiology | Admitting: Cardiology

## 2022-09-10 ENCOUNTER — Encounter: Payer: Self-pay | Admitting: Cardiology

## 2022-09-10 VITALS — BP 126/84 | HR 67 | Ht 61.0 in | Wt 155.0 lb

## 2022-09-10 DIAGNOSIS — R002 Palpitations: Secondary | ICD-10-CM | POA: Insufficient documentation

## 2022-09-10 DIAGNOSIS — E7800 Pure hypercholesterolemia, unspecified: Secondary | ICD-10-CM | POA: Diagnosis not present

## 2022-09-10 DIAGNOSIS — E7849 Other hyperlipidemia: Secondary | ICD-10-CM | POA: Diagnosis not present

## 2022-09-10 DIAGNOSIS — I2510 Atherosclerotic heart disease of native coronary artery without angina pectoris: Secondary | ICD-10-CM | POA: Diagnosis not present

## 2022-09-10 LAB — HEPATIC FUNCTION PANEL
ALT: 13 IU/L (ref 0–32)
AST: 18 IU/L (ref 0–40)
Albumin: 4.4 g/dL (ref 3.8–4.8)
Alkaline Phosphatase: 114 IU/L (ref 44–121)
Bilirubin Total: 0.4 mg/dL (ref 0.0–1.2)
Bilirubin, Direct: 0.13 mg/dL (ref 0.00–0.40)
Total Protein: 7.3 g/dL (ref 6.0–8.5)

## 2022-09-10 LAB — LIPID PANEL
Chol/HDL Ratio: 3.1 ratio (ref 0.0–4.4)
Cholesterol, Total: 199 mg/dL (ref 100–199)
HDL: 65 mg/dL (ref >39)
LDL Chol Calc (NIH): 122 mg/dL — ABNORMAL HIGH (ref 0–99)
Triglycerides: 66 mg/dL (ref 0–149)
VLDL Cholesterol Cal: 12 mg/dL (ref 5–40)

## 2022-09-10 LAB — TSH: TSH: 0.193 u[IU]/mL — ABNORMAL LOW (ref 0.450–4.500)

## 2022-09-10 NOTE — Patient Instructions (Signed)
Medication Instructions:   STOP AMLODIPINE  *If you need a refill on your cardiac medications before your next appointment, please call your pharmacy*   Follow-Up: At [facility], you and your health needs are our priority.  As part of our continuing mission to provide you with exceptional heart care, we have created designated Provider Care Teams.  These Care Teams include your primary Cardiologist (physician) and Advanced Practice Providers (APPs -  Physician Assistants and Nurse Practitioners) who all work together to provide you with the care you need, when you need it.  We recommend signing up for the patient portal called "MyChart".  Sign up information is provided on this After Visit Summary.  MyChart is used to connect with patients for Virtual Visits (Telemedicine).  Patients are able to view lab/test results, encounter notes, upcoming appointments, etc.  Non-urgent messages can be sent to your provider as well.   To learn more about what you can do with MyChart, go to [email or web address].    Your next appointment:   12 month(s)  The format for your next appointment:   In Person  Provider:   Kirk Ruths, MD

## 2022-09-14 ENCOUNTER — Other Ambulatory Visit: Payer: Self-pay | Admitting: *Deleted

## 2022-09-14 DIAGNOSIS — E7849 Other hyperlipidemia: Secondary | ICD-10-CM

## 2022-09-14 DIAGNOSIS — E7800 Pure hypercholesterolemia, unspecified: Secondary | ICD-10-CM

## 2022-09-14 MED ORDER — EZETIMIBE 10 MG PO TABS: 10.0000 mg | ORAL_TABLET | Freq: Every day | ORAL | 3 refills | Status: DC

## 2022-09-15 ENCOUNTER — Telehealth: Payer: Self-pay | Admitting: Cardiology

## 2022-09-15 DIAGNOSIS — H2513 Age-related nuclear cataract, bilateral: Secondary | ICD-10-CM | POA: Diagnosis not present

## 2022-09-15 DIAGNOSIS — D3131 Benign neoplasm of right choroid: Secondary | ICD-10-CM | POA: Diagnosis not present

## 2022-09-15 NOTE — Telephone Encounter (Signed)
Pt c/o medication issue:  1. Name of Medication: amlodipine '5mg'$    2. How are you currently taking this medication (dosage and times per day)? 1 tablet daily   3. Are you having a reaction (difficulty breathing--STAT)?   4. What is your medication issue? Patient states she was taken off the amlodipine after a few days her BP started going up, she wants to know if she should start back on it.  Pt c/o medication issue:  1. Name of Medication: rosuvastatin (CRESTOR) 5 MG tablet   2. How are you currently taking this medication (dosage and times per day)?    TAKE 1 TABLET BY MOUTH 3 (THREE) TIMES A WEEK.    3. Are you having a reaction (difficulty breathing--STAT)?   4. What is your medication issue? Her PCP told her to start taking it everyday.   She wants to know if she should go on the rosuvastatin everyday as advised by her PCP and not start the ezetimibe (ZETIA) 10 MG tablet. Please advise.

## 2022-09-15 NOTE — Telephone Encounter (Signed)
Spoke to patient.She stated Dr.Aronson's office called her this morning.Dr.Aronson reviewed recent lab and advised her to take Rosuvastatin to 5 mg everyday.Stated she cannot afford Zetia. She will repeat fasting lipid and hepatic panels in 8 weeks.He also decreased Synthroid to 112 mg 1 tablet daily for 6 days then 1/2 tablet on 7th day. She wanted Dr.Crenshaw to know her B/P has been elevated since she stopped taking Amlodipine.Systolic ranging 341,[phone],229.She will restart Amlodipine 5 mg daily today.She will continue to monitor B/P and call back if remains elevated.I will make Dr.Crenshaw aware.

## 2022-09-22 DIAGNOSIS — N9089 Other specified noninflammatory disorders of vulva and perineum: Secondary | ICD-10-CM | POA: Diagnosis not present

## 2022-10-27 ENCOUNTER — Other Ambulatory Visit: Payer: Self-pay | Admitting: Cardiology

## 2022-11-11 DIAGNOSIS — E7800 Pure hypercholesterolemia, unspecified: Secondary | ICD-10-CM | POA: Diagnosis not present

## 2022-11-11 DIAGNOSIS — E7849 Other hyperlipidemia: Secondary | ICD-10-CM | POA: Diagnosis not present

## 2022-11-12 LAB — HEPATIC FUNCTION PANEL
ALT: 34 IU/L — ABNORMAL HIGH (ref 0–32)
AST: 33 IU/L (ref 0–40)
Albumin: 4.2 g/dL (ref 3.8–4.8)
Alkaline Phosphatase: 115 IU/L (ref 44–121)
Bilirubin Total: 0.4 mg/dL (ref 0.0–1.2)
Bilirubin, Direct: 0.14 mg/dL (ref 0.00–0.40)
Total Protein: 7 g/dL (ref 6.0–8.5)

## 2022-11-12 LAB — LIPID PANEL
Chol/HDL Ratio: 2.4 ratio (ref 0.0–4.4)
Cholesterol, Total: 151 mg/dL (ref 100–199)
HDL: 62 mg/dL (ref >39)
LDL Chol Calc (NIH): 77 mg/dL (ref 0–99)
Triglycerides: 55 mg/dL (ref 0–149)
VLDL Cholesterol Cal: 12 mg/dL (ref 5–40)

## 2022-12-08 DIAGNOSIS — E039 Hypothyroidism, unspecified: Secondary | ICD-10-CM | POA: Diagnosis not present

## 2022-12-16 ENCOUNTER — Telehealth: Payer: Self-pay | Admitting: Cardiology

## 2022-12-16 MED ORDER — ROSUVASTATIN CALCIUM 5 MG PO TABS: 5.0000 mg | ORAL_TABLET | Freq: Every day | ORAL | 3 refills | Status: DC

## 2022-12-16 NOTE — Telephone Encounter (Signed)
Attempted to call patient, left message for patient to call back to office.   

## 2022-12-16 NOTE — Telephone Encounter (Signed)
Refill sent to requested pharmacy for the daily SIG

## 2022-12-16 NOTE — Telephone Encounter (Signed)
Pt is returning call.  

## 2022-12-16 NOTE — Telephone Encounter (Signed)
Pt c/o medication issue:  1. Name of Medication:   rosuvastatin (CRESTOR) 5 MG tablet    2. How are you currently taking this medication (dosage and times per day)? Every day   3. Are you having a reaction (difficulty breathing--STAT)?   4. What is your medication issue? Patient is needing a new script sent in to pharmacy due to changing the amount of times she takes It per week. She now takes this everyday instead of 3x daily.

## 2022-12-28 DIAGNOSIS — E039 Hypothyroidism, unspecified: Secondary | ICD-10-CM | POA: Diagnosis not present

## 2023-01-13 DIAGNOSIS — L600 Ingrowing nail: Secondary | ICD-10-CM | POA: Diagnosis not present

## 2023-01-13 DIAGNOSIS — M25571 Pain in right ankle and joints of right foot: Secondary | ICD-10-CM | POA: Diagnosis not present

## 2023-02-21 ENCOUNTER — Other Ambulatory Visit: Payer: Self-pay | Admitting: Cardiology

## 2023-02-21 DIAGNOSIS — I10 Essential (primary) hypertension: Secondary | ICD-10-CM

## 2023-03-14 ENCOUNTER — Other Ambulatory Visit: Payer: Self-pay | Admitting: Cardiology

## 2023-04-13 DIAGNOSIS — R140 Abdominal distension (gaseous): Secondary | ICD-10-CM | POA: Diagnosis not present

## 2023-04-13 DIAGNOSIS — Z01419 Encounter for gynecological examination (general) (routine) without abnormal findings: Secondary | ICD-10-CM | POA: Diagnosis not present

## 2023-04-13 DIAGNOSIS — L900 Lichen sclerosus et atrophicus: Secondary | ICD-10-CM | POA: Diagnosis not present

## 2023-04-13 DIAGNOSIS — Z1231 Encounter for screening mammogram for malignant neoplasm of breast: Secondary | ICD-10-CM | POA: Diagnosis not present

## 2023-04-13 DIAGNOSIS — N951 Menopausal and female climacteric states: Secondary | ICD-10-CM | POA: Diagnosis not present

## 2023-04-20 DIAGNOSIS — R140 Abdominal distension (gaseous): Secondary | ICD-10-CM | POA: Diagnosis not present

## 2023-05-17 DIAGNOSIS — E785 Hyperlipidemia, unspecified: Secondary | ICD-10-CM | POA: Diagnosis not present

## 2023-05-17 DIAGNOSIS — I10 Essential (primary) hypertension: Secondary | ICD-10-CM | POA: Diagnosis not present

## 2023-05-17 DIAGNOSIS — E039 Hypothyroidism, unspecified: Secondary | ICD-10-CM | POA: Diagnosis not present

## 2023-05-23 DIAGNOSIS — Z1212 Encounter for screening for malignant neoplasm of rectum: Secondary | ICD-10-CM | POA: Diagnosis not present

## 2023-05-24 DIAGNOSIS — I10 Essential (primary) hypertension: Secondary | ICD-10-CM | POA: Diagnosis not present

## 2023-05-24 DIAGNOSIS — Z1339 Encounter for screening examination for other mental health and behavioral disorders: Secondary | ICD-10-CM | POA: Diagnosis not present

## 2023-05-24 DIAGNOSIS — Z0000 Encounter for general adult medical examination without abnormal findings: Secondary | ICD-10-CM | POA: Diagnosis not present

## 2023-05-24 DIAGNOSIS — Z1331 Encounter for screening for depression: Secondary | ICD-10-CM | POA: Diagnosis not present

## 2023-05-24 DIAGNOSIS — E039 Hypothyroidism, unspecified: Secondary | ICD-10-CM | POA: Diagnosis not present

## 2023-05-24 DIAGNOSIS — R82998 Other abnormal findings in urine: Secondary | ICD-10-CM | POA: Diagnosis not present

## 2023-05-24 DIAGNOSIS — R5383 Other fatigue: Secondary | ICD-10-CM | POA: Diagnosis not present

## 2023-05-24 DIAGNOSIS — L900 Lichen sclerosus et atrophicus: Secondary | ICD-10-CM | POA: Diagnosis not present

## 2023-05-24 DIAGNOSIS — E785 Hyperlipidemia, unspecified: Secondary | ICD-10-CM | POA: Diagnosis not present

## 2023-05-24 DIAGNOSIS — M79604 Pain in right leg: Secondary | ICD-10-CM | POA: Diagnosis not present

## 2023-06-28 DIAGNOSIS — L814 Other melanin hyperpigmentation: Secondary | ICD-10-CM | POA: Diagnosis not present

## 2023-06-28 DIAGNOSIS — D225 Melanocytic nevi of trunk: Secondary | ICD-10-CM | POA: Diagnosis not present

## 2023-06-28 DIAGNOSIS — I788 Other diseases of capillaries: Secondary | ICD-10-CM | POA: Diagnosis not present

## 2023-06-28 DIAGNOSIS — L817 Pigmented purpuric dermatosis: Secondary | ICD-10-CM | POA: Diagnosis not present

## 2023-06-28 DIAGNOSIS — D1801 Hemangioma of skin and subcutaneous tissue: Secondary | ICD-10-CM | POA: Diagnosis not present

## 2023-06-28 DIAGNOSIS — L821 Other seborrheic keratosis: Secondary | ICD-10-CM | POA: Diagnosis not present

## 2023-06-28 DIAGNOSIS — L720 Epidermal cyst: Secondary | ICD-10-CM | POA: Diagnosis not present

## 2023-07-20 IMAGING — MG MM DIGITAL SCREENING BILAT W/ TOMO AND CAD
8 series · 8 of 24 positions shown · non-contrast
Comparison: Previous exam(s).

CLINICAL DATA: Screening.

EXAM:
DIGITAL SCREENING BILATERAL MAMMOGRAM WITH TOMOSYNTHESIS AND CAD
TECHNIQUE: Bilateral screening digital craniocaudal and mediolateral oblique
mammograms were obtained. Bilateral screening digital breast
tomosynthesis was performed. The images were evaluated with
computer-aided detection.

[R CC synth-2D]
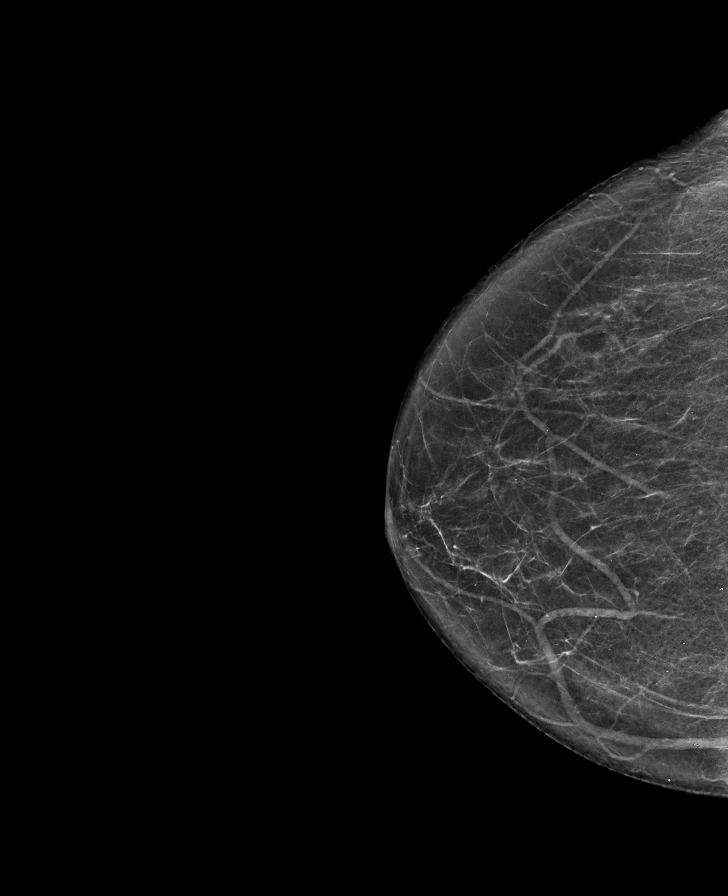

[L CC synth-2D]
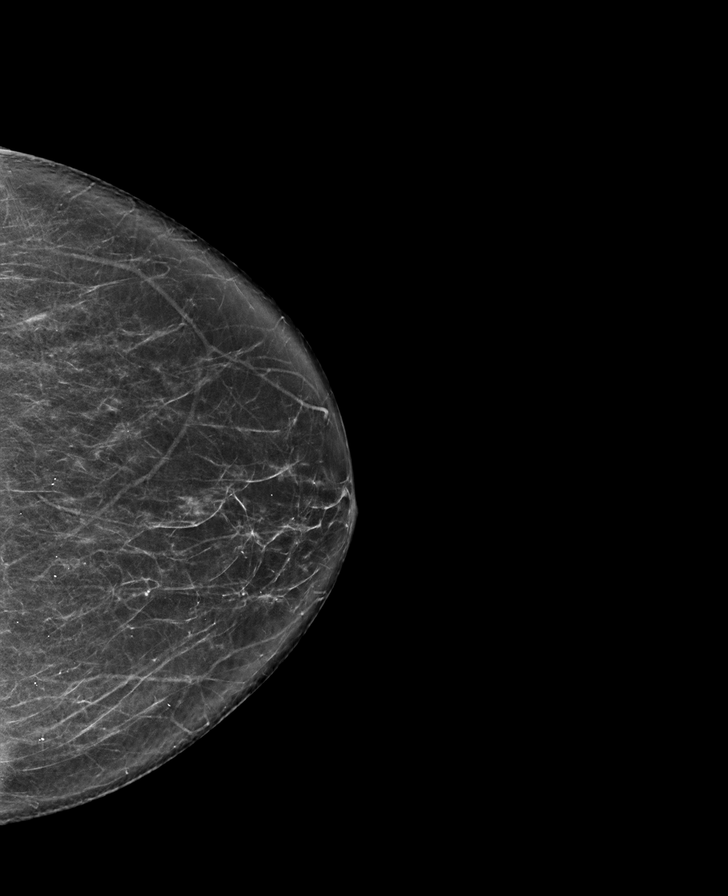

[R MLO synth-2D]
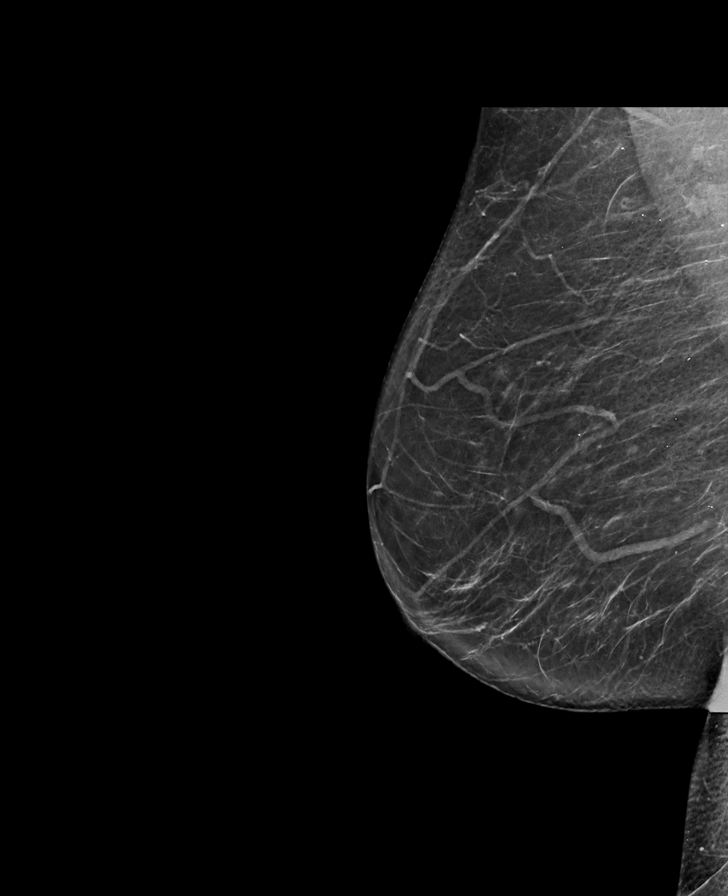

[L MLO synth-2D]
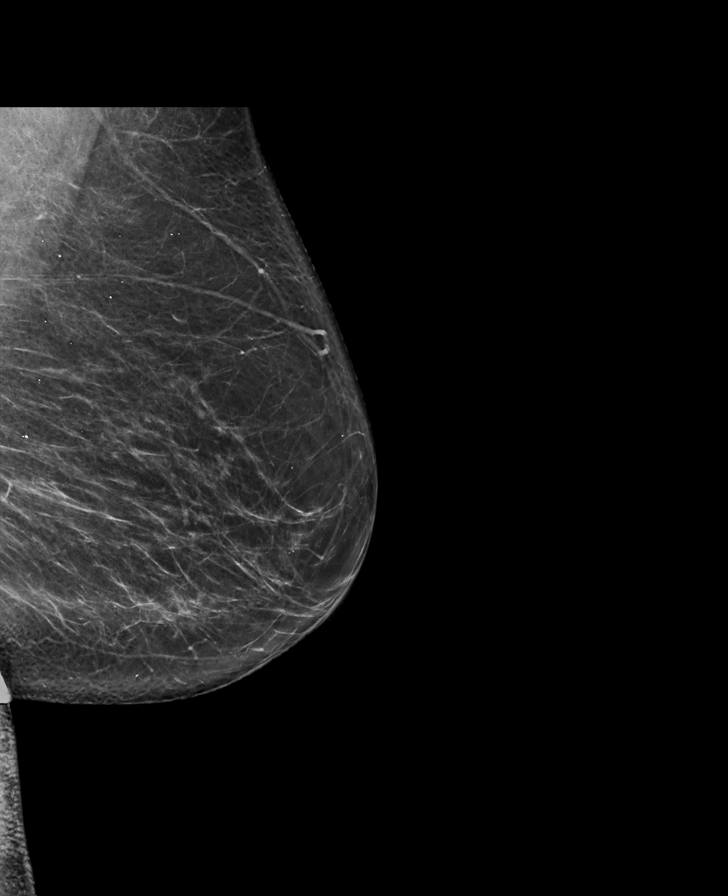

[R CC tomo · tomo slice 37/73.0]
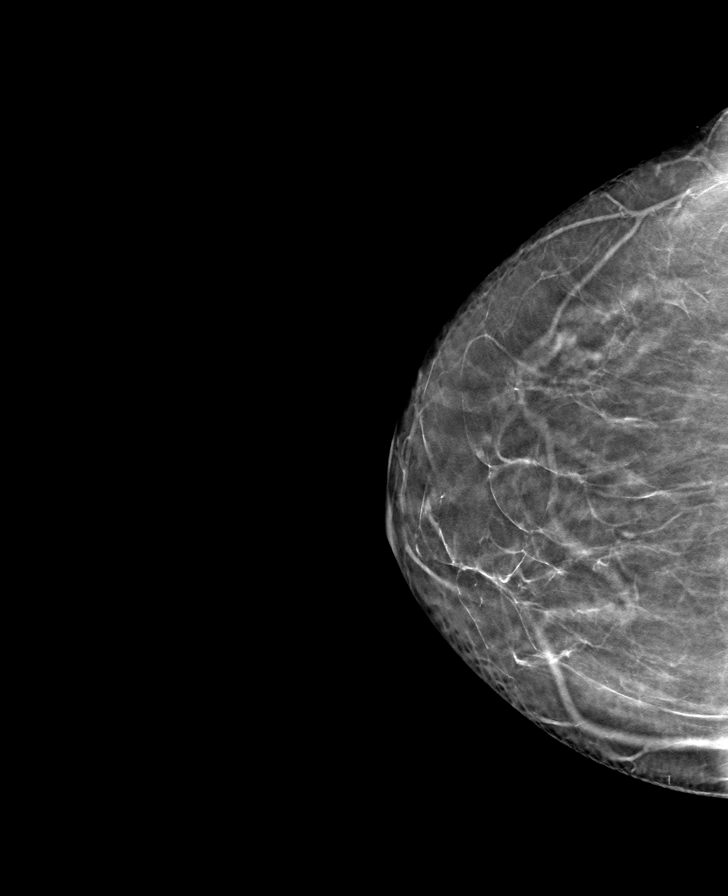

[L MLO tomo · tomo slice 39/77.0]
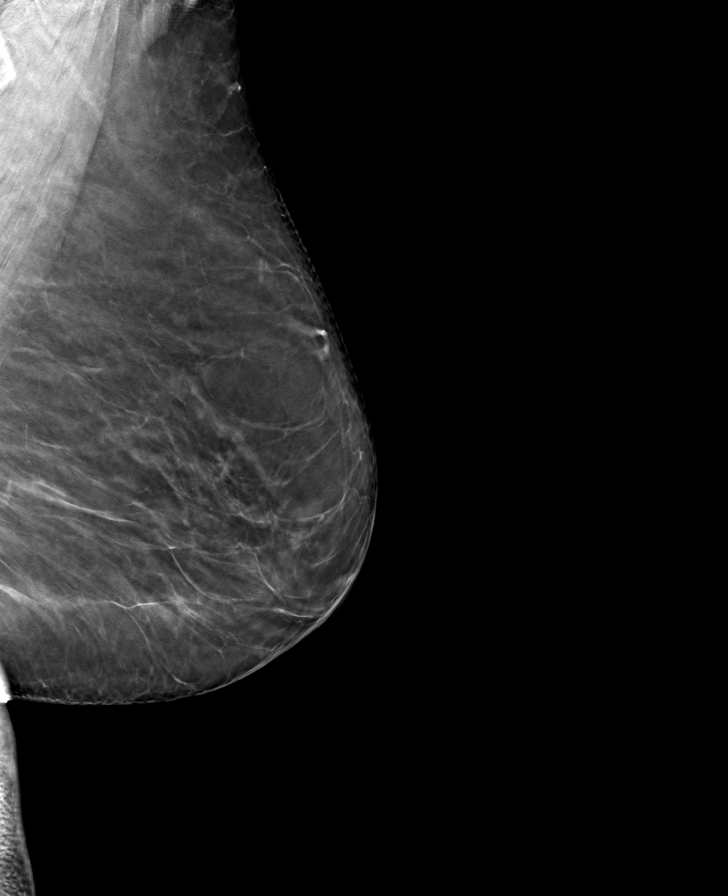

[R MLO tomo · tomo slice 39/77.0]
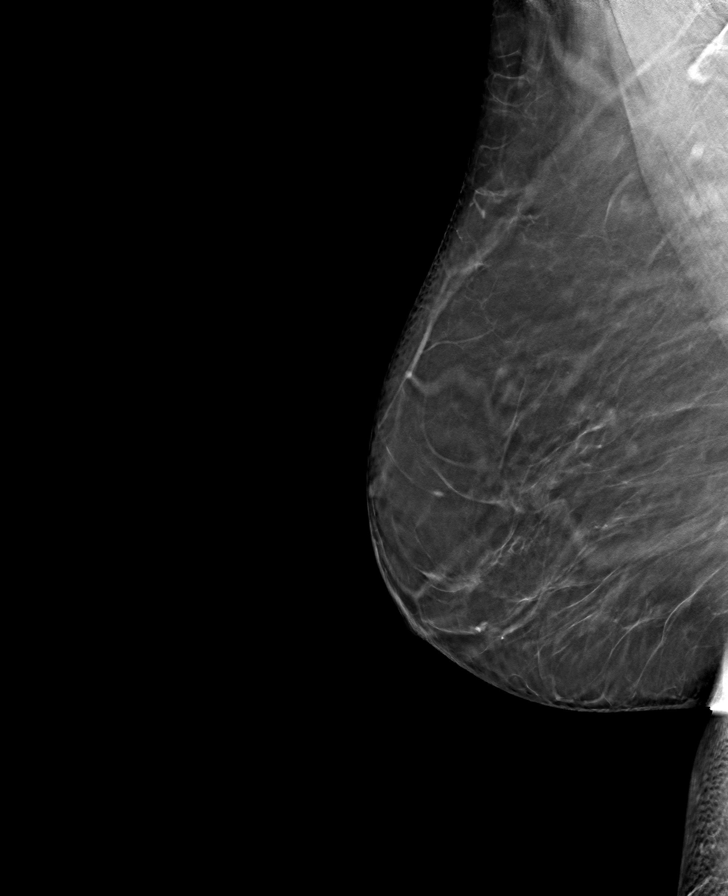

[L CC tomo · tomo slice 37/73.0]
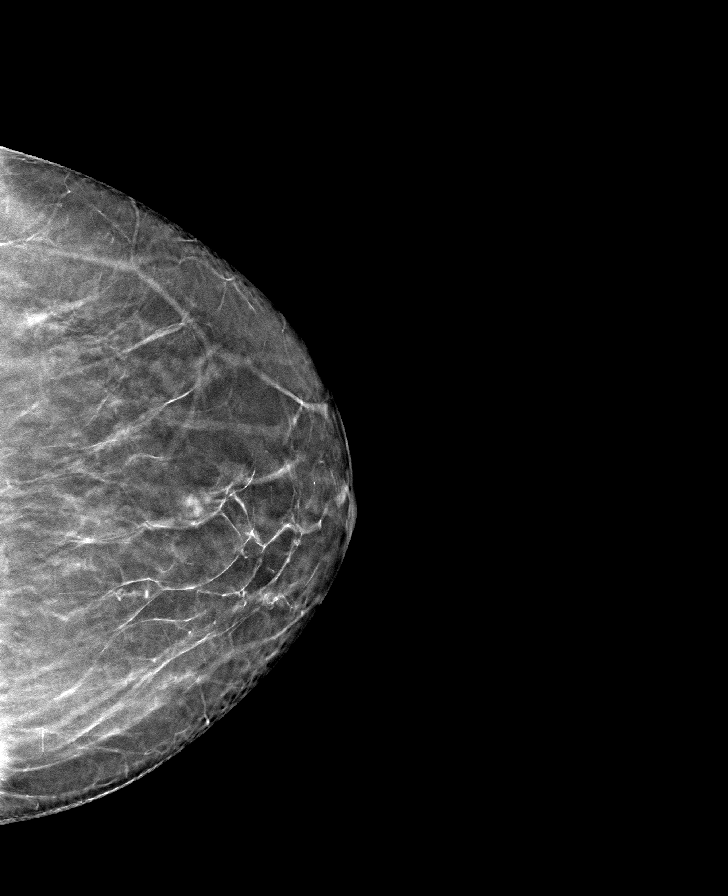

[8 of 24 positions shown; findings below may reference images not displayed]

ACR Breast Density Category b: There are scattered areas of
fibroglandular density.
FINDINGS: In the right breast, a possible asymmetry warrants further
evaluation. In the left breast, no findings suspicious for
malignancy.
IMPRESSION: Further evaluation is suggested for possible asymmetry in the right
breast.

RECOMMENDATION:
Diagnostic mammogram and possibly ultrasound of the right breast.
(Code:BU-C-IIK)

The patient will be contacted regarding the findings, and additional
imaging will be scheduled.

BI-RADS CATEGORY  0: Incomplete. Need additional imaging evaluation
and/or prior mammograms for comparison.

## 2023-07-25 ENCOUNTER — Other Ambulatory Visit: Payer: Self-pay | Admitting: Cardiology

## 2023-07-25 DIAGNOSIS — E7800 Pure hypercholesterolemia, unspecified: Secondary | ICD-10-CM

## 2023-07-25 DIAGNOSIS — E7849 Other hyperlipidemia: Secondary | ICD-10-CM

## 2023-07-26 IMAGING — MG MM DIGITAL DIAGNOSTIC UNILAT*R* W/ TOMO W/ CAD
4 series · 4 of 12 positions shown · non-contrast
Comparison: Previous exams including recent screening mammogram
dated 03/26/2022 and earlier screening mammograms dated 08/03/2018
and 11/20/2016.

CLINICAL DATA: Patient returns today to evaluate a possible RIGHT
breast asymmetry questioned on recent screening mammogram.

EXAM:
DIGITAL DIAGNOSTIC UNILATERAL RIGHT MAMMOGRAM WITH TOMOSYNTHESIS AND
CAD
TECHNIQUE: Right digital diagnostic mammography and breast tomosynthesis was
performed. The images were evaluated with computer-aided detection.

[R ML synth-2D]
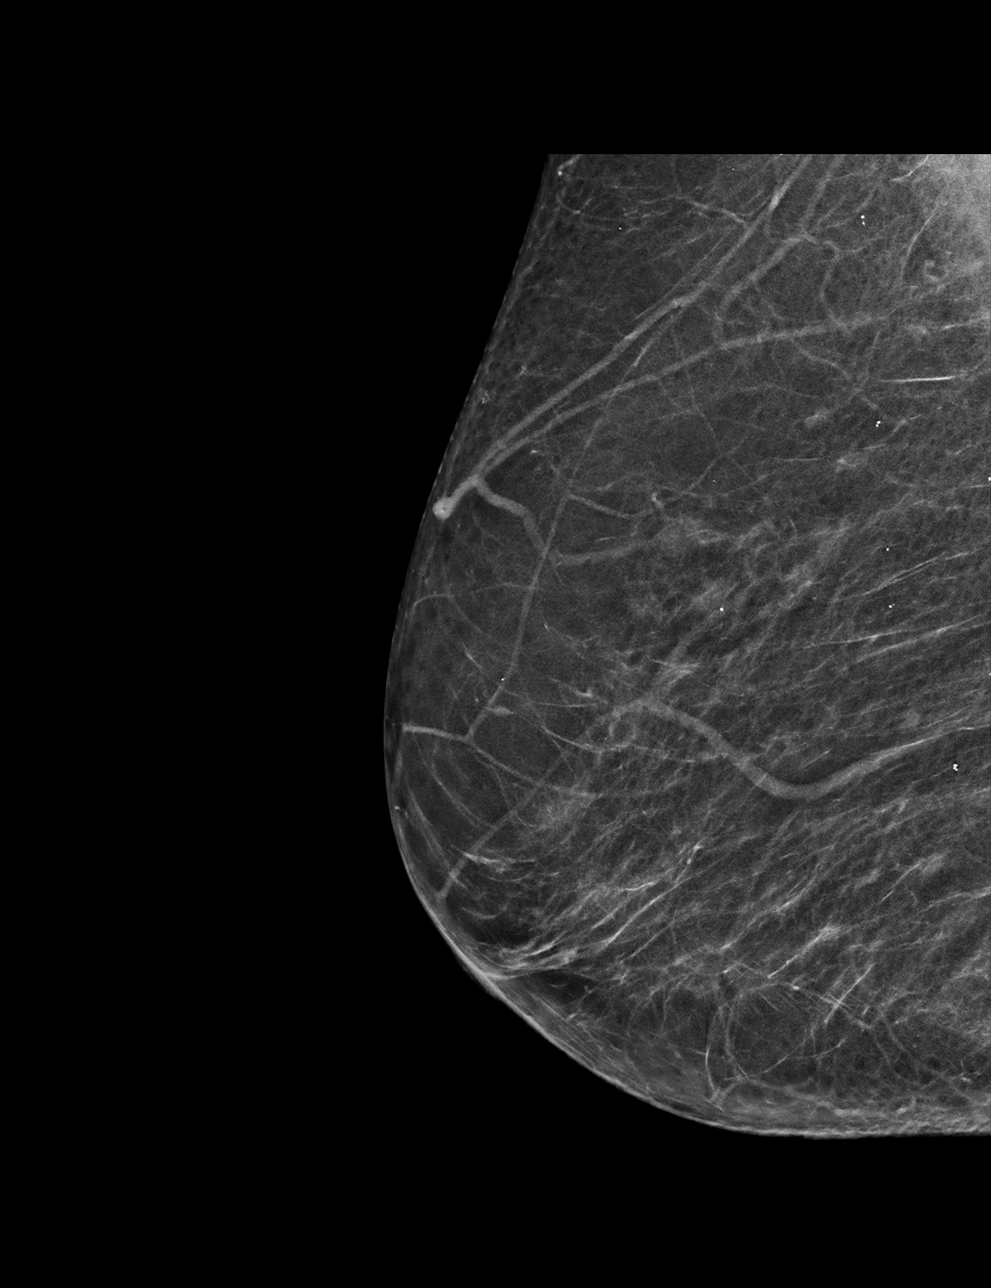

[R MLO synth-2D]
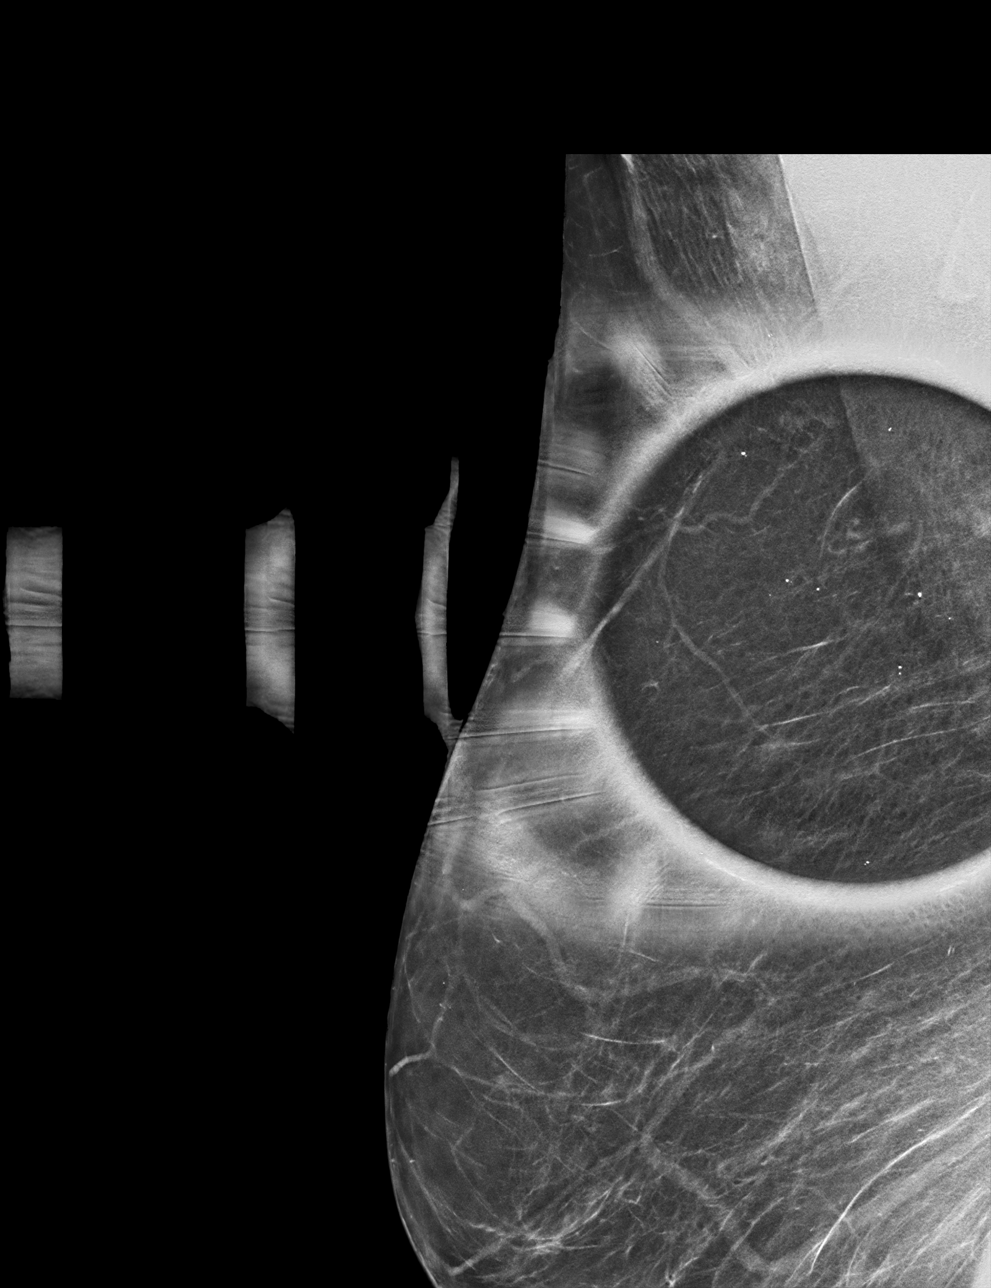

[R ML tomo · tomo slice 30/59.0]
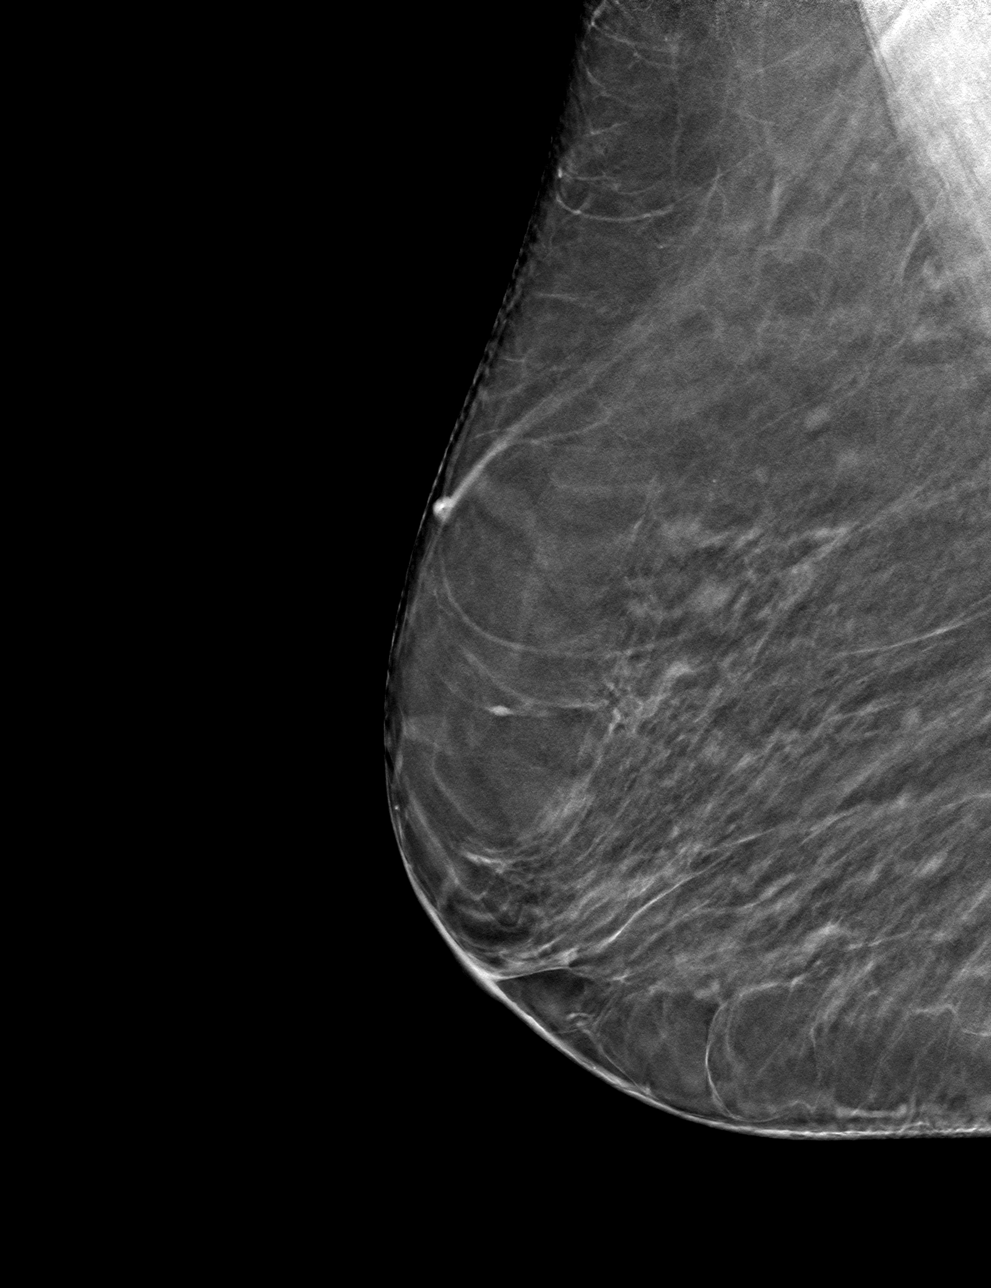

[R MLO tomo · tomo slice 32/63.0]
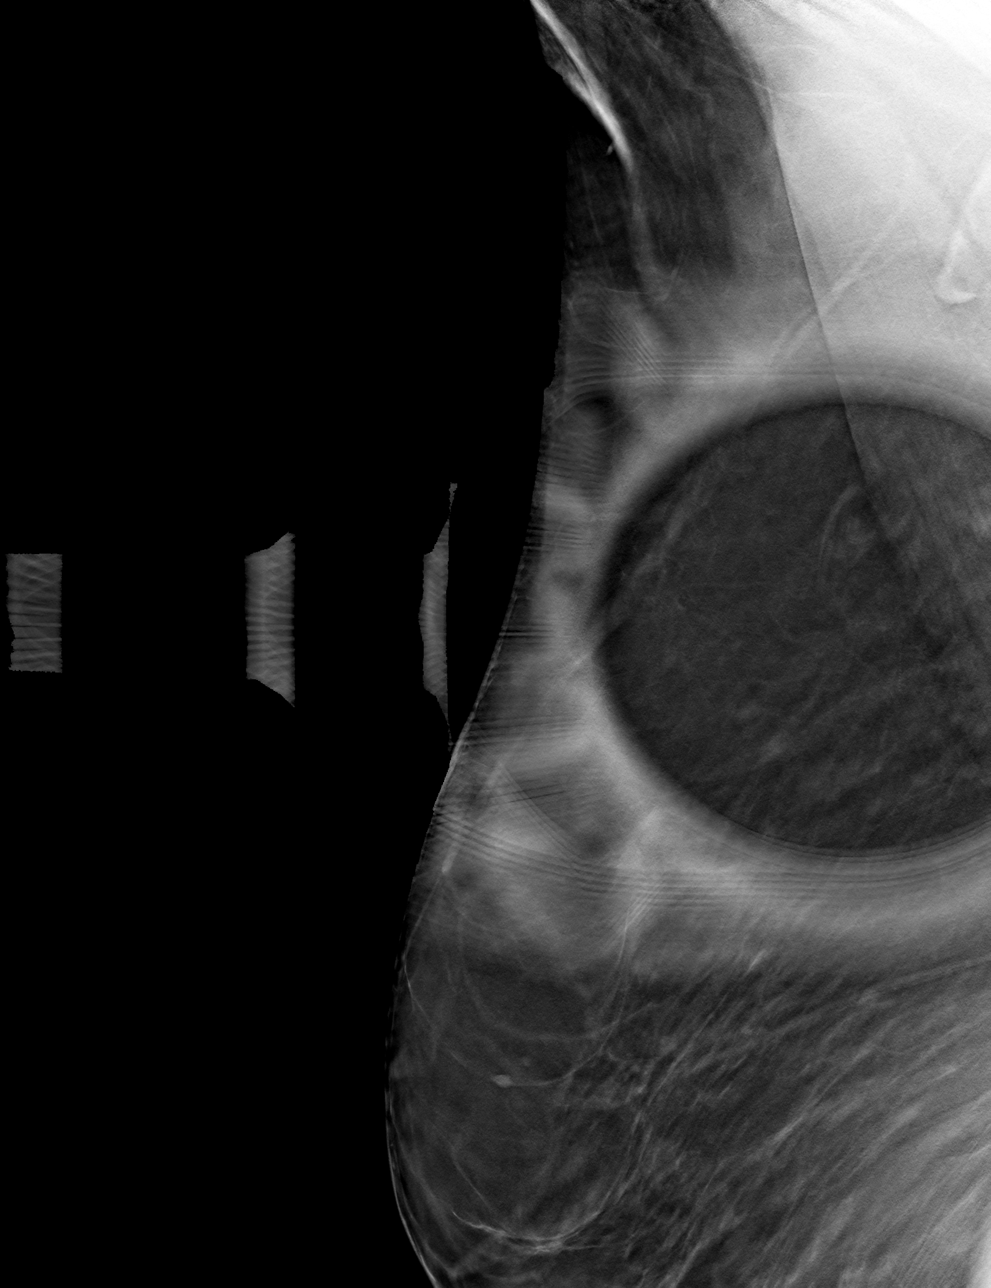

[4 of 12 positions shown; findings below may reference images not displayed]

ACR Breast Density Category b: There are scattered areas of
fibroglandular density.
FINDINGS: Additional diagnostic views were obtained today, including spot
compression with 3D tomosynthesis. The questioned asymmetry within
the upper RIGHT breast, at far posterior depth, appears stable
compared to the previous study of 08/03/2018 confirming benignity,
compatible with a benign lymph node or cluster of small lymph nodes
located in the lower axilla/axillary tail region.
IMPRESSION: No evidence of malignancy.

Patient may return to routine annual bilateral screening mammogram
schedule.

RECOMMENDATION:
Screening mammogram in one year.(Code:VO-1-IEY)

I have discussed the findings and recommendations with the patient.
If applicable, a reminder letter will be sent to the patient
regarding the next appointment.

BI-RADS CATEGORY  2: Benign.

## 2023-08-18 ENCOUNTER — Other Ambulatory Visit: Payer: Self-pay | Admitting: Cardiology

## 2023-08-18 DIAGNOSIS — I10 Essential (primary) hypertension: Secondary | ICD-10-CM

## 2023-08-24 DIAGNOSIS — Z23 Encounter for immunization: Secondary | ICD-10-CM | POA: Diagnosis not present

## 2023-09-02 DIAGNOSIS — L600 Ingrowing nail: Secondary | ICD-10-CM | POA: Diagnosis not present

## 2023-09-09 ENCOUNTER — Other Ambulatory Visit: Payer: Self-pay | Admitting: Cardiology

## 2023-09-09 DIAGNOSIS — I10 Essential (primary) hypertension: Secondary | ICD-10-CM

## 2023-09-13 ENCOUNTER — Encounter: Payer: Self-pay | Admitting: Nurse Practitioner

## 2023-09-13 ENCOUNTER — Ambulatory Visit: Payer: Medicare Other | Attending: Nurse Practitioner | Admitting: Nurse Practitioner

## 2023-09-13 VITALS — BP 118/88 | HR 68 | Ht 61.0 in | Wt 163.2 lb

## 2023-09-13 DIAGNOSIS — I2510 Atherosclerotic heart disease of native coronary artery without angina pectoris: Secondary | ICD-10-CM | POA: Insufficient documentation

## 2023-09-13 DIAGNOSIS — R002 Palpitations: Secondary | ICD-10-CM | POA: Insufficient documentation

## 2023-09-13 DIAGNOSIS — E785 Hyperlipidemia, unspecified: Secondary | ICD-10-CM | POA: Insufficient documentation

## 2023-09-13 DIAGNOSIS — E039 Hypothyroidism, unspecified: Secondary | ICD-10-CM | POA: Diagnosis not present

## 2023-09-13 DIAGNOSIS — I10 Essential (primary) hypertension: Secondary | ICD-10-CM | POA: Diagnosis not present

## 2023-09-13 MED ORDER — CARVEDILOL 12.5 MG PO TABS: 12.5000 mg | ORAL_TABLET | Freq: Two times a day (BID) | ORAL | 3 refills | Status: DC

## 2023-09-13 NOTE — Progress Notes (Signed)
Office Visit    Patient Name: Casey Kramer Date of Encounter: 09/13/2023  Primary Care Provider:  Geoffry Paradise, MD Primary Cardiologist:  Olga Millers, MD  Chief Complaint    72 year old female with a history of CAD (coronary artery calcification noted on prior CT scan), palpitations, PAT, PACs, hypertension, hyperlipidemia, and hypothyroidism who presents for follow-up related to CAD and hypertension.  Past Medical History    Past Medical History:  Diagnosis Date   CAD (coronary artery disease)    Hyperlipidemia    Hypertension    Hypothyroid    Past Surgical History:  Procedure Laterality Date   laproscopic endometrial surgrey     ORIF ANKLE FRACTURE Right 07/17/2021   Procedure: OPEN REDUCTION INTERNAL FIXATION (ORIF) right ankle bimalleolar fracture;  Surgeon: Toni Arthurs, MD;  Location:  [facility];  Service: Orthopedics;  Laterality: Right;     Allergies  Allergies  Allergen Reactions   Statins Other (See Comments)    Sensitivity to pain/aching joints   Amlodipine Other (See Comments)    Reaction is unknown   Aspirin     Can tolerate 81mg  daily, higher doses give indigestion    Naproxen Swelling and Other (See Comments)    Lips swell.     Labs/Other Studies Reviewed    The following studies were reviewed today:  Cardiac Studies & Procedures     STRESS TESTS  MYOCARDIAL PERFUSION IMAGING 01/31/2013   ECHOCARDIOGRAM  ECHOCARDIOGRAM COMPLETE 06/07/2019  Narrative ECHOCARDIOGRAM REPORT    Patient Name:   Casey Kramer Date of Exam: 06/07/2019 Medical Rec #:  [id number]       Height:       62.0 in Accession #:    [id number]      Weight:       156.0 lb Date of Birth:  1950-12-02       BSA:          1.72 m Patient Age:    68 years        BP:           172/91 mmHg Patient Gender: F               HR:           61 bpm. Exam Location:  [address]   Procedure: 2D Echo, Cardiac Doppler, Color Doppler and  Intracardiac Opacification Agent  Indications:    R00.2 palpitations  History:        Patient has prior history of Echocardiogram examinations, most recent 01/31/2013. CAD Risk Factors: Hypertension and HLD.  Sonographer:    Clearence Ped RCS Referring Phys: 78 BRIAN S CRENSHAW  IMPRESSIONS   1. The left ventricle has normal systolic function with an ejection fraction of 60-65%. The cavity size was normal. Left ventricular diastolic Doppler parameters are consistent with pseudonormalization. 2. The right ventricle has normal systolic function. The cavity was normal. There is no increase in right ventricular wall thickness. 3. Trivial pericardial effusion is present. 4. No evidence of mitral valve stenosis. 5. No stenosis of the aortic valve. 6. The aorta is normal unless otherwise noted. 7. The aortic root and ascending aorta are normal in size and structure. 8. The atrial septum is grossly normal.  FINDINGS Left Ventricle: The left ventricle has normal systolic function, with an ejection fraction of 60-65%. The cavity size was normal. There is no increase in left ventricular wall thickness. Left ventricular diastolic Doppler parameters are consistent with pseudonormalization. Definity contrast  agent was given IV to delineate the left ventricular endocardial borders.  Right Ventricle: The right ventricle has normal systolic function. The cavity was normal. There is no increase in right ventricular wall thickness.  Left Atrium: Left atrial size was normal in size.  Right Atrium: Right atrial size was normal in size. Right atrial pressure is estimated at 3 mmHg.  Interatrial Septum: The atrial septum is grossly normal.  Pericardium: Trivial pericardial effusion is present.  Mitral Valve: The mitral valve is normal in structure. Mitral valve regurgitation is trivial by color flow Doppler. No evidence of mitral valve stenosis.  Tricuspid Valve: The tricuspid valve is normal in  structure. Tricuspid valve regurgitation is mild by color flow Doppler.  Aortic Valve: The aortic valve is normal in structure. Aortic valve regurgitation was not visualized by color flow Doppler. There is No stenosis of the aortic valve.  Pulmonic Valve: The pulmonic valve was grossly normal. Pulmonic valve regurgitation is not visualized by color flow Doppler.  Aorta: The aortic root and ascending aorta are normal in size and structure. The aorta is normal unless otherwise noted.   +--------------+--------++ LEFT VENTRICLE         +----------------+---------++ +--------------+--------++ Diastology                PLAX 2D                +----------------+---------++ +--------------+--------++ LV e' lateral:  7.07 cm/s LVIDd:        4.47 cm  +----------------+---------++ +--------------+--------++ LV E/e' lateral:13.0      LVIDs:        2.47 cm  +----------------+---------++ +--------------+--------++ LV e' medial:   5.11 cm/s LV PW:        1.04 cm  +----------------+---------++ +--------------+--------++ LV E/e' medial: 17.9      LV IVS:       0.84 cm  +----------------+---------++ +--------------+--------++ LVOT diam:    1.90 cm  +--------------+--------++ LV SV:        69 ml    +--------------+--------++ LV SV Index:  38.92    +--------------+--------++ LVOT Area:    2.84 cm +--------------+--------++                        +--------------+--------++  +---------------+----------++ RIGHT VENTRICLE           +---------------+----------++ RV Basal diam: 3.37 cm    +---------------+----------++ RV S prime:    10.70 cm/s +---------------+----------++ TAPSE (M-mode):1.9 cm     +---------------+----------++ RVSP:          27.4 mmHg  +---------------+----------++  +---------------+-------++-----------++ LEFT ATRIUM           Index       +---------------+-------++-----------++ LA diam:        3.00 cm1.74 cm/m  +---------------+-------++-----------++ LA Vol (A2C):  42.6 ml24.77 ml/m +---------------+-------++-----------++ LA Vol (A4C):  36.2 ml21.05 ml/m +---------------+-------++-----------++ LA Biplane Vol:39.8 ml23.14 ml/m +---------------+-------++-----------++ +------------+---------++-----------++ RIGHT ATRIUM         Index       +------------+---------++-----------++ RA Pressure:3.00 mmHg            +------------+---------++-----------++ RA Area:    9.84 cm             +------------+---------++-----------++ RA Volume:  17.60 ml 10.23 ml/m +------------+---------++-----------++ +------------+-----------++ AORTIC VALVE            +------------+-----------++ LVOT Vmax:  79.10 cm/s  +------------+-----------++ LVOT Vmean: 50.900 cm/s +------------+-----------++ LVOT VTI:   0.220 m     +------------+-----------++  +-------------+-------++  AORTA                +-------------+-------++ Ao Root diam:2.80 cm +-------------+-------++  +--------------+--------++   +---------------+-----------++ MITRAL VALVE             TRICUSPID VALVE            +--------------+--------++   +---------------+-----------++ MV Area (PHT):           TR Peak grad:  24.4 mmHg   +--------------+--------++   +---------------+-----------++ MV PHT:                  TR Vmax:       266.00 cm/s +--------------+--------++   +---------------+-----------++ MV Decel Time:183 msec   Estimated RAP: 3.00 mmHg   +--------------+--------++   +---------------+-----------++ +--------------+----------++ RVSP:          27.4 mmHg   MV E velocity:91.70 cm/s +---------------+-----------++ +--------------+----------++ MV A velocity:86.80 cm/s +--------------+-------+ +--------------+----------++ SHUNTS                MV E/A ratio: 1.06        +--------------+-------+ +--------------+----------++ Systemic VTI: 0.22 m  +--------------+-------+ Systemic Diam:1.90 cm +--------------+-------+   Kristeen Miss MD Electronically signed by Kristeen Miss MD Signature Date/Time: 06/07/2019/1:18:26 PM    Final    MONITORS  LONG TERM MONITOR (3-14 DAYS) 11/25/2021  Narrative  3 episodes of SVT, longest lasting 9 beats  No significant arrhythmias   Patch Wear Time:  5 days and 22 hours (2023-02-01T21:03:11-498 to 2023-02-07T19:54:09-499)  Patient had a min HR of 51 bpm, max HR of 146 bpm, and avg HR of 61 bpm. Predominant underlying rhythm was Sinus Rhythm. 3 Supraventricular Tachycardia runs occurred, the run with the fastest interval lasting 9 beats with a max rate of 146 bpm (avg 133 bpm); the run with the fastest interval was also the longest. True duration of Supraventricular Tachycardia difficult to ascertain due to artifact. Supraventricular Tachycardia was detected within +/- 45 seconds of symptomatic patient event(s). Isolated SVEs were rare (<1.0%), SVE Couplets were rare (<1.0%), and no SVE Triplets were present. No Isolated VEs, VE Couplets, or VE Triplets were present.  108 patient triggered events, corresponding to sinus rhythm  PACs, SVT.          Recent Labs: 11/11/2022: ALT 34  Recent Lipid Panel    Component Value Date/Time   CHOL 151 11/11/2022 0937   TRIG 55 11/11/2022 0937   HDL 62 11/11/2022 0937   CHOLHDL 2.4 11/11/2022 0937   LDLCALC 77 11/11/2022 0937    History of Present Illness    72 year old female with the above past medical history including CAD (coronary artery calcification noted on prior CT scan), palpitations, PAT, PACs, hypertension, hyperlipidemia, and hypothyroidism.  She has a history of coronary calcification noted on prior CT scan.  Nuclear study in July 2018 showed diaphragmatic attenuation, no evidence of ischemia, EF 73%.  Echocardiogram in August 2020 showed normal LV  function, G2 DD.  She was evaluated in the ED in February 2023 in the setting of palpitations.  Cardiac monitor at the time showed sinus rhythm with short runs of PAT, PACs.  She was last seen in the office on 09/10/2022 and was doing well from a cardiac standpoint.  She noted occasional orthostatic dizziness. Amlodipine was held.  Of note, she has not tolerated high-dose statin therapy, she previously declined PCSK9 inhibitor.  She presents today for follow-up.  Since her last visit she has been stable from a cardiac standpoint.  She  notes intermittent palpitations, these are generally fleeting, denies any associated symptoms.  She notes an occasional discomfort in her epigastric region after eating, denies any exertional symptoms concerning for angina.  BP has been stable.  Overall, she reports feeling well.  Home Medications    Current Outpatient Medications  Medication Sig Dispense Refill   acetaminophen (TYLENOL) 500 MG tablet Take 500 mg by mouth as needed.     amLODipine (NORVASC) 5 MG tablet Take 5 mg by mouth daily.     aspirin 81 MG EC tablet take one a day     atorvastatin (LIPITOR) 10 MG tablet Take 10 mg by mouth every other day.     Cholecalciferol (VITAMIN D3) 50 MCG (2000 UT) capsule Take 2,000 Units by mouth daily.     Clobetasol Prop Emollient Base (CLOBETASOL PROPIONATE E EX) Apply topically as needed.     estradiol (ESTRACE) 0.1 MG/GM vaginal cream as needed.     ezetimibe (ZETIA) 10 MG tablet TAKE 1 TABLET BY MOUTH EVERY DAY 90 tablet 0   levothyroxine (SYNTHROID) 100 MCG tablet Take 100 mcg by mouth every other day.     meclizine (ANTIVERT) 25 MG tablet Take 1 tablet (25 mg total) by mouth 4 (four) times daily as needed for dizziness. 30 tablet 0   rosuvastatin (CRESTOR) 5 MG tablet TAKE 1 TABLET (5 MG TOTAL) BY MOUTH DAILY. 90 tablet 1   carvedilol (COREG) 12.5 MG tablet Take 1 tablet (12.5 mg total) by mouth 2 (two) times daily. 90 tablet 3   No current  facility-administered medications for this visit.     Review of Systems    She denies chest pain, dyspnea, pnd, orthopnea, n, v, dizziness, syncope, edema, weight gain, or early satiety. All other systems reviewed and are otherwise negative except as noted above.   Physical Exam    VS:  BP 118/88   Pulse 68   Ht 5\' 1"  (1.549 m)   Wt 163 lb 3.2 oz (74 kg)   SpO2 98%   BMI 30.84 kg/m   GEN: Well nourished, well developed, in no acute distress. HEENT: normal. Neck: Supple, no JVD, carotid bruits, or masses. Cardiac: RRR, no murmurs, rubs, or gallops. No clubbing, cyanosis, edema.  Radials/DP/PT 2+ and equal bilaterally.  Respiratory:  Respirations regular and unlabored, clear to auscultation bilaterally. GI: Soft, nontender, nondistended, BS + x 4. MS: no deformity or atrophy. Skin: warm and dry, no rash. Neuro:  Strength and sensation are intact. Psych: Normal affect.  Accessory Clinical Findings    ECG personally reviewed by me today - EKG Interpretation Date/Time:  Monday September 13 2023 09:23:28 EST Ventricular Rate:  68 PR Interval:  158 QRS Duration:  70 QT Interval:  404 QTC Calculation: 429 R Axis:   -4  Text Interpretation: Normal sinus rhythm Normal ECG When compared with ECG of 21-Nov-2021 00:51, PREVIOUS ECG IS PRESENT Confirmed by Bernadene Person ([id number]) on 09/13/2023 9:24:40 AM  - no acute changes.   Lab Results  Component Value Date   WBC 7.5 11/20/2021   HGB 14.2 11/20/2021   HCT 44.0 11/20/2021   MCV 86.8 11/20/2021   PLT 271 11/20/2021   Lab Results  Component Value Date   CREATININE 0.82 11/20/2021   BUN 18 11/20/2021   NA 138 11/20/2021   K 3.5 11/20/2021   CL 102 11/20/2021   CO2 25 11/20/2021   Lab Results  Component Value Date   ALT 34 (H) 11/11/2022   AST 33  11/11/2022   ALKPHOS 115 11/11/2022   BILITOT 0.4 11/11/2022   Lab Results  Component Value Date   CHOL 151 11/11/2022   HDL 62 11/11/2022   LDLCALC 77 11/11/2022   TRIG 55  11/11/2022   CHOLHDL 2.4 11/11/2022    No results found for: "HGBA1C"  Assessment & Plan   1. CAD: Coronary artery calcification noted on prior CT scan. Stable with no anginal symptoms. No indication for ischemic evaluation.  Continue aspirin, carvedilol, amlodipine, Crestor, Zetia.  2. Palpitations: Cardiac monitor in 2023 showed sinus rhythm with short runs of PAT, PACs.  She notes intermittent fleeting palpitations, overall stable.  Continue carvedilol.  3. Hypertension: BP well controlled. Continue current antihypertensive regimen.   4. Hyperlipidemia: LDL was 77 in 10/2022. Will repeat fasting lipids, CMET today.   5. Hypothyroidism:  TSH was 0.193 in 08/2022. Will check TSH today per pt request. Managed per PCP.   6. Disposition: Follow-up in 1 year.       Joylene Grapes, NP 09/13/2023, 9:48 AM

## 2023-09-13 NOTE — Patient Instructions (Addendum)
Medication Instructions:  Your physician recommends that you continue on your current medications as directed. Please refer to the Current Medication list given to you today.  *If you need a refill on your cardiac medications before your next appointment, please call your pharmacy*   Lab Work: Tsh, Fasting Lipid panel, CMET, today  Testing/Procedures: NONE ordered at this time of appointment    Follow-Up: At [facility], you and your health needs are our priority.  As part of our continuing mission to provide you with exceptional heart care, we have created designated Provider Care Teams.  These Care Teams include your primary Cardiologist (physician) and Advanced Practice Providers (APPs -  Physician Assistants and Nurse Practitioners) who all work together to provide you with the care you need, when you need it.  We recommend signing up for the patient portal called "MyChart".  Sign up information is provided on this After Visit Summary.  MyChart is used to connect with patients for Virtual Visits (Telemedicine).  Patients are able to view lab/test results, encounter notes, upcoming appointments, etc.  Non-urgent messages can be sent to your provider as well.   To learn more about what you can do with MyChart, go to [email or web address].    Your next appointment:   1 year(s)  Provider:   Olga Millers, MD     Other Instructions

## 2023-09-14 LAB — COMPREHENSIVE METABOLIC PANEL
ALT: 17 [IU]/L (ref 0–32)
AST: 23 [IU]/L (ref 0–40)
Albumin: 4.4 g/dL (ref 3.8–4.8)
Alkaline Phosphatase: 121 [IU]/L (ref 44–121)
BUN/Creatinine Ratio: 18 (ref 12–28)
BUN: 15 mg/dL (ref 8–27)
Bilirubin Total: 0.5 mg/dL (ref 0.0–1.2)
CO2: 25 mmol/L (ref 20–29)
Calcium: 10 mg/dL (ref 8.7–10.3)
Chloride: 104 mmol/L (ref 96–106)
Creatinine, Ser: 0.83 mg/dL (ref 0.57–1.00)
Globulin, Total: 3.1 g/dL (ref 1.5–4.5)
Glucose: 89 mg/dL (ref 70–99)
Potassium: 4.8 mmol/L (ref 3.5–5.2)
Sodium: 142 mmol/L (ref 134–144)
Total Protein: 7.5 g/dL (ref 6.0–8.5)
eGFR: 75 mL/min/{1.73_m2} (ref >59)

## 2023-09-14 LAB — LIPID PANEL
Chol/HDL Ratio: 3.2 {ratio} (ref 0.0–4.4)
Cholesterol, Total: 195 mg/dL (ref 100–199)
HDL: 61 mg/dL (ref >39)
LDL Chol Calc (NIH): 118 mg/dL — ABNORMAL HIGH (ref 0–99)
Triglycerides: 87 mg/dL (ref 0–149)
VLDL Cholesterol Cal: 16 mg/dL (ref 5–40)

## 2023-09-14 LAB — TSH: TSH: 0.429 u[IU]/mL — ABNORMAL LOW (ref 0.450–4.500)

## 2023-09-16 ENCOUNTER — Telehealth: Payer: Self-pay | Admitting: Nurse Practitioner

## 2023-09-16 ENCOUNTER — Telehealth: Payer: Self-pay

## 2023-09-16 DIAGNOSIS — E785 Hyperlipidemia, unspecified: Secondary | ICD-10-CM

## 2023-09-16 NOTE — Telephone Encounter (Signed)
Lmom to discuss lab results and recommendations.  

## 2023-09-16 NOTE — Telephone Encounter (Signed)
Casey Grapes, NP 09/14/2023 10:29 AM EST     Recent labs show thyroid function is mildly abnormal.  Recommend follow-up with PCP for ongoing management.  Kidney function, liver enzymes, and electrolytes were stable.  Cholesterol is elevated above goal.  If she is interested in considering alternative lipid-lowering therapy, we can refer her to our lipid clinic Pharm.[facility]. to discuss options.  Otherwise, continue current medications and follow-up as planned.  Thank you-EM   .Patient identification verified by 2 forms. Casey Rail, RN    Called and spoke to patient  Relayed provider result message  Patient states she takes cholesterol medication appropriately  Pt agrees with pharmacy referral  Informed patient referral placed, she will be outreached for appointment  Patient has no further questions at this time

## 2023-09-16 NOTE — Telephone Encounter (Signed)
Pt is returning call to nurse for lab results

## 2023-09-20 DIAGNOSIS — L600 Ingrowing nail: Secondary | ICD-10-CM | POA: Diagnosis not present

## 2023-10-26 DIAGNOSIS — D3131 Benign neoplasm of right choroid: Secondary | ICD-10-CM | POA: Diagnosis not present

## 2023-10-26 DIAGNOSIS — H2513 Age-related nuclear cataract, bilateral: Secondary | ICD-10-CM | POA: Diagnosis not present

## 2023-10-29 ENCOUNTER — Other Ambulatory Visit: Payer: Self-pay | Admitting: Cardiology

## 2023-10-29 DIAGNOSIS — E7849 Other hyperlipidemia: Secondary | ICD-10-CM

## 2023-10-29 DIAGNOSIS — E7800 Pure hypercholesterolemia, unspecified: Secondary | ICD-10-CM

## 2023-11-12 ENCOUNTER — Ambulatory Visit: Payer: Medicare Other | Attending: Cardiology | Admitting: Pharmacist Clinician (PhC)/ Clinical Pharmacy Specialist

## 2023-11-12 ENCOUNTER — Encounter: Payer: Self-pay | Admitting: Pharmacist Clinician (PhC)/ Clinical Pharmacy Specialist

## 2023-11-12 ENCOUNTER — Other Ambulatory Visit: Payer: Self-pay | Admitting: Cardiology

## 2023-11-12 DIAGNOSIS — E7849 Other hyperlipidemia: Secondary | ICD-10-CM | POA: Insufficient documentation

## 2023-11-12 NOTE — Progress Notes (Unsigned)
Office Visit    Patient Name: Casey Kramer Date of Encounter: 11/15/2023  Primary Care Provider:  Geoffry Paradise, MD Primary Cardiologist:  Olga Millers, MD  Chief Complaint    Hyperlipidemia   Significant Past Medical History   CAD On CT scan  palpitataions Esp when thyroid is off  HTN Controlled on meds     Allergies  Allergen Reactions   Statins Other (See Comments)    Sensitivity to pain/aching joints   Amlodipine Other (See Comments)    Reaction is unknown   Aspirin     Can tolerate 81mg  daily, higher doses give indigestion    Naproxen Swelling and Other (See Comments)    Lips swell.    History of Present Illness    Casey Kramer is a 73 y.o. female patient of Dr Jens Som, in the office today to discuss options for cholesterol management.  She was not able to tolerate rosuvastatin 5 mg daily, secondary to myalgias.    Insurance Carrier: BCBS Plan G  LDL Cholesterol goal:  LDL < 100  Current Medications:   ezetimibe 10 mg daily,  Previously tried:  rosuvastatin, atorvastatin, pravastatin - all caused myalgias  Family Hx:  mother had 3 MI and then stroke; father had AV replacement; brother had stroke in his 15's; both kids with high cholesterol, daughter has thyroid, son has DM2, fatty liver, htn   Social Hx: Tobacco: no Alcohol: no   Diet:   mix of home and eating out (ff, sandwich shops, sit down); variety of proteins, (occasionally fried),  vegetables, fresh or frozen; does admit to snacking   Exercise: none, does try to walk in warmer weather - broke ankle in 2022  Accessory Clinical Findings   Lab Results  Component Value Date   CHOL 195 09/13/2023   HDL 61 09/13/2023   LDLCALC 118 (H) 09/13/2023   TRIG 87 09/13/2023   CHOLHDL 3.2 09/13/2023    No results found for: "LIPOA"  Lab Results  Component Value Date   ALT 17 09/13/2023   AST 23 09/13/2023   ALKPHOS 121 09/13/2023   BILITOT 0.5 09/13/2023   Lab Results  Component  Value Date   CREATININE 0.83 09/13/2023   BUN 15 09/13/2023   NA 142 09/13/2023   K 4.8 09/13/2023   CL 104 09/13/2023   CO2 25 09/13/2023   No results found for: "HGBA1C"  Home Medications    Current Outpatient Medications  Medication Sig Dispense Refill   acetaminophen (TYLENOL) 500 MG tablet Take 500 mg by mouth as needed.     amLODipine (NORVASC) 5 MG tablet Take 5 mg by mouth daily.     aspirin 81 MG EC tablet take one a day     carvedilol (COREG) 12.5 MG tablet Take 1 tablet (12.5 mg total) by mouth 2 (two) times daily. 90 tablet 3   Cholecalciferol (VITAMIN D3) 50 MCG (2000 UT) capsule Take 2,000 Units by mouth daily.     Clobetasol Prop Emollient Base (CLOBETASOL PROPIONATE E EX) Apply topically as needed.     estradiol (ESTRACE) 0.1 MG/GM vaginal cream as needed.     ezetimibe (ZETIA) 10 MG tablet TAKE 1 TABLET BY MOUTH EVERY DAY 90 tablet 3   levothyroxine (SYNTHROID) 88 MCG tablet Take 88 mcg by mouth daily before breakfast.     meclizine (ANTIVERT) 25 MG tablet Take 1 tablet (25 mg total) by mouth 4 (four) times daily as needed for dizziness. 30 tablet 0  rosuvastatin (CRESTOR) 5 MG tablet TAKE 1 TABLET (5 MG TOTAL) BY MOUTH DAILY. 90 tablet 3   No current facility-administered medications for this visit.     Assessment & Plan    Familial hyperlipidemia Assessment: Patient with LDL not at goal of < 100 Most recent LDL 118 on 09/13/2023 Has been compliant with ezetimibe 10 mg Not able to tolerate statins secondary to myalgias Reviewed options for lowering LDL cholesterol, including ezetimibe, PCSK-9 inhibitors, bempedoic acid and inclisiran.  Discussed mechanisms of action, dosing, side effects, potential decreases in LDL cholesterol and costs.  Also reviewed potential options for patient assistance.  Plan: Patient agreeable to starting Leqvio Try rosuvastatin 5 mg twice weekly (Monday and Friday) Repeat labs after:  4 months Lipid Liver function    Phillips Hay, PharmD CPP [facility] [address]  [place], [place] [address] [phone]  11/15/2023, 4:25 PM

## 2023-11-12 NOTE — Patient Instructions (Addendum)
Your Results:             Your most recent labs Goal  Total Cholesterol 195 < 200  Triglycerides 87 < 150  HDL (happy/good cholesterol) 61 > 40  LDL (lousy/bad cholesterol 118 < 100   Medication changes:  We will start the process to get Leqvio covered by your insurance.  Once the prior authorization is complete, I will call/send a MyChart message to let you know and confirm pharmacy information.   First two doses are 3 months apart, then you will get one every 6 months  Take the rosuvastatin 5 mg every Monday and Friday.    Lab orders:  We want to repeat labs about a month after your second injection (about 4 months).  We will send you a lab order to remind you once we get closer to that time.    Thank you for choosing [facility]

## 2023-11-15 ENCOUNTER — Other Ambulatory Visit: Payer: Self-pay | Admitting: Pharmacist Clinician (PhC)/ Clinical Pharmacy Specialist

## 2023-11-15 NOTE — Assessment & Plan Note (Signed)
Assessment: Patient with LDL not at goal of < 100 Most recent LDL 118 on 09/13/2023 Has been compliant with ezetimibe 10 mg Not able to tolerate statins secondary to myalgias Reviewed options for lowering LDL cholesterol, including ezetimibe, PCSK-9 inhibitors, bempedoic acid and inclisiran.  Discussed mechanisms of action, dosing, side effects, potential decreases in LDL cholesterol and costs.  Also reviewed potential options for patient assistance.  Plan: Patient agreeable to starting Leqvio Try rosuvastatin 5 mg twice weekly (Monday and Friday) Repeat labs after:  4 months Lipid Liver function

## 2023-11-16 ENCOUNTER — Telehealth: Payer: Self-pay | Admitting: Pharmacy Technician

## 2023-11-16 NOTE — Telephone Encounter (Signed)
 Casey Kramer Submission: NO AUTH NEEDED Site of care: Site of care: [facility] Payer: Medicare a/b Medication & CPT/J Code(s) submitted: Leqvio  (Inclisiran) J1306 Route of submission (phone, fax, portal):  Phone # Fax # Auth type: Buy/Bill HB Units/visits requested: x3 doses Reference number:  Approval from: 11/16/23 to 10/11/24    Patient will be responsible for the remaining 20%. Patient will need assistance: [employer] aware

## 2023-11-17 ENCOUNTER — Encounter: Payer: Self-pay | Admitting: Cardiology

## 2023-11-18 NOTE — Telephone Encounter (Signed)
 Patient now has BCBS supplement and will no longer need patient assistance. [employer] is aware

## 2023-11-23 ENCOUNTER — Encounter: Payer: Self-pay | Admitting: Cardiology

## 2023-11-30 ENCOUNTER — Ambulatory Visit: Payer: Medicare Other

## 2023-11-30 VITALS — BP 145/84 | HR 68 | Temp 97.8°F | Resp 16 | Ht 61.0 in | Wt 164.4 lb

## 2023-11-30 DIAGNOSIS — E7849 Other hyperlipidemia: Secondary | ICD-10-CM

## 2023-11-30 MED ORDER — INCLISIRAN SODIUM 284 MG/1.5ML ~~LOC~~ SOSY
284.0000 mg | PREFILLED_SYRINGE | Freq: Once | SUBCUTANEOUS | Status: AC
  Administered 2023-11-30: 284 mg via SUBCUTANEOUS
  Filled 2023-11-30: qty 1.5

## 2023-11-30 NOTE — Progress Notes (Signed)
Diagnosis: Hyperlipidemia  Provider:  Chilton Greathouse MD  Procedure: Injection  Leqvio (inclisiran), Dose: 284 mg, Site: subcutaneous, Number of injections: 1  Injection Site(s): Left lower quad. abdomen  Post Care: Observation period completed  Discharge: Condition: Good, Destination: Home . AVS Provided  Performed by:  Marlow Baars [place], RN

## 2023-11-30 NOTE — Patient Instructions (Signed)
 Inclisiran Injection What is this medication? INCLISIRAN (in kli SIR an) treats high cholesterol. It works by decreasing bad cholesterol (such as LDL) in your blood. Changes to diet and exercise are often combined with this medication. This medicine may be used for other purposes; ask your health care provider or pharmacist if you have questions. COMMON BRAND NAME(S): LEQVIO What should I tell my care team before I take this medication? They need to know if you have any of these conditions: An unusual or allergic reaction to inclisiran, other medications, foods, dyes, or preservatives Pregnant or trying to get pregnant Breast-feeding How should I use this medication? This medication is injected under the skin. It is given by your care team in a hospital or clinic setting. Talk to your care team about the use of this medication in children. Special care may be needed. Overdosage: If you think you have taken too much of this medicine contact a poison control center or emergency room at once. NOTE: This medicine is only for you. Do not share this medicine with others. What if I miss a dose? Keep appointments for follow-up doses. It is important not to miss your dose. Call your care team if you are unable to keep an appointment. What may interact with this medication? Interactions are not expected. This list may not describe all possible interactions. Give your health care provider a list of all the medicines, herbs, non-prescription drugs, or dietary supplements you use. Also tell them if you smoke, drink alcohol, or use illegal drugs. Some items may interact with your medicine. What should I watch for while using this medication? Visit your care team for regular checks on your progress. Tell your care team if your symptoms do not start to get better or if they get worse. You may need blood work while you are taking this medication. What side effects may I notice from receiving this  medication? Side effects that you should report to your care team as soon as possible: Allergic reactions--skin rash, itching, hives, swelling of the face, lips, tongue, or throat Side effects that usually do not require medical attention (report these to your care team if they continue or are bothersome): Joint pain Pain, redness, or irritation at injection site This list may not describe all possible side effects. Call your doctor for medical advice about side effects. You may report side effects to FDA at 1-800-FDA-1088. Where should I keep my medication? This medication is given in a hospital or clinic. It will not be stored at home. NOTE: This sheet is a summary. It may not cover all possible information. If you have questions about this medicine, talk to your doctor, pharmacist, or health care provider.  2024 Elsevier/Gold Standard (2023-09-10 00:00:00)

## 2023-12-24 ENCOUNTER — Encounter: Payer: Self-pay | Admitting: Cardiology

## 2024-01-29 ENCOUNTER — Emergency Department (HOSPITAL_COMMUNITY)

## 2024-01-29 ENCOUNTER — Other Ambulatory Visit: Payer: Self-pay

## 2024-01-29 ENCOUNTER — Emergency Department (HOSPITAL_COMMUNITY)
Admission: EM | Admit: 2024-01-29 | Discharge: 2024-01-29 | Disposition: A | Discharge status: Home / Self Care | Attending: Emergency Medicine | Admitting: Emergency Medicine

## 2024-01-29 ENCOUNTER — Encounter (HOSPITAL_COMMUNITY): Payer: Self-pay | Admitting: Emergency Medicine

## 2024-01-29 DIAGNOSIS — S52352A Displaced comminuted fracture of shaft of radius, left arm, initial encounter for closed fracture: Secondary | ICD-10-CM | POA: Insufficient documentation

## 2024-01-29 DIAGNOSIS — Z7982 Long term (current) use of aspirin: Secondary | ICD-10-CM | POA: Insufficient documentation

## 2024-01-29 DIAGNOSIS — S62102A Fracture of unspecified carpal bone, left wrist, initial encounter for closed fracture: Secondary | ICD-10-CM

## 2024-01-29 DIAGNOSIS — W010XXA Fall on same level from slipping, tripping and stumbling without subsequent striking against object, initial encounter: Secondary | ICD-10-CM | POA: Insufficient documentation

## 2024-01-29 DIAGNOSIS — I10 Essential (primary) hypertension: Secondary | ICD-10-CM | POA: Insufficient documentation

## 2024-01-29 DIAGNOSIS — S52612A Displaced fracture of left ulna styloid process, initial encounter for closed fracture: Secondary | ICD-10-CM | POA: Insufficient documentation

## 2024-01-29 DIAGNOSIS — S6992XA Unspecified injury of left wrist, hand and finger(s), initial encounter: Secondary | ICD-10-CM | POA: Diagnosis present

## 2024-01-29 DIAGNOSIS — I2510 Atherosclerotic heart disease of native coronary artery without angina pectoris: Secondary | ICD-10-CM | POA: Diagnosis not present

## 2024-01-29 DIAGNOSIS — Z79899 Other long term (current) drug therapy: Secondary | ICD-10-CM | POA: Insufficient documentation

## 2024-01-29 DIAGNOSIS — S52502A Unspecified fracture of the lower end of left radius, initial encounter for closed fracture: Secondary | ICD-10-CM | POA: Diagnosis not present

## 2024-01-29 DIAGNOSIS — S52572A Other intraarticular fracture of lower end of left radius, initial encounter for closed fracture: Secondary | ICD-10-CM | POA: Diagnosis not present

## 2024-01-29 MED ORDER — OXYCODONE-ACETAMINOPHEN 5-325 MG PO TABS: ORAL_TABLET | ORAL | 0 refills | Status: AC

## 2024-01-29 NOTE — ED Provider Notes (Signed)
 [place] EMERGENCY DEPARTMENT AT [facility] Provider Note   CSN: [id number] Arrival date & time: 01/29/24  1138     History {Add pertinent medical, surgical, social history, OB history to HPI:1} Chief Complaint  Patient presents with   Wrist Pain    TAHEERAH GULDIN is a 73 y.o. female.  Patient has a history of coronary artery disease and hypertension.  She fell today landed on her left wrist   Wrist Pain       Home Medications Prior to Admission medications   Medication Sig Start Date End Date Taking? Authorizing Provider  oxyCODONE -acetaminophen  (PERCOCET/ROXICET) 5-325 MG tablet Take 1 every 6 hours for pain not relieved by Tylenol  or Motrin alone 01/29/24  Yes Makayia Duplessis, MD  acetaminophen  (TYLENOL ) 500 MG tablet Take 500 mg by mouth as needed.    [provider]  amLODipine  (NORVASC ) 5 MG tablet Take 5 mg by mouth daily.    [provider]  aspirin  81 MG EC tablet take one a day 09/29/17   [provider]  carvedilol  (COREG ) 12.5 MG tablet Take 1 tablet (12.5 mg total) by mouth 2 (two) times daily. 09/13/23   Jude Norton, NP  Cholecalciferol (VITAMIN D3) 50 MCG (2000 UT) capsule Take 2,000 Units by mouth daily.    [provider]  Clobetasol Prop Emollient Base (CLOBETASOL PROPIONATE E EX) Apply topically as needed.    [provider]  estradiol (ESTRACE) 0.1 MG/GM vaginal cream as needed.    [provider]  ezetimibe  (ZETIA ) 10 MG tablet TAKE 1 TABLET BY MOUTH EVERY DAY 10/29/23   Lenise Quince, MD  levothyroxine (SYNTHROID) 88 MCG tablet Take 88 mcg by mouth daily before breakfast.    [provider]  meclizine  (ANTIVERT ) 25 MG tablet Take 1 tablet (25 mg total) by mouth 4 (four) times daily as needed for dizziness. 07/26/20   Jhonnie Mosher, PA-C  rosuvastatin  (CRESTOR ) 5 MG tablet TAKE 1 TABLET (5 MG TOTAL) BY MOUTH DAILY. 11/12/23   Lenise Quince, MD      Allergies     Statins, Amlodipine , Aspirin , and Naproxen    Review of Systems   Review of Systems  Physical Exam Updated Vital Signs BP (!) 187/91   Pulse 73   Temp 98.3 F (36.8 C) (Oral)   Resp 18   Ht 5\' 1"  (1.549 m)   Wt 73.5 kg   SpO2 96%   BMI 30.61 kg/m  Physical Exam  ED Results / Procedures / Treatments   Labs (all labs ordered are listed, but only abnormal results are displayed) Labs Reviewed - No data to display  EKG None  Radiology No results found.  Procedures Procedures  {Document cardiac monitor, telemetry assessment procedure when appropriate:1}  Medications Ordered in ED Medications - No data to display  ED Course/ Medical Decision Making/ A&P   {   Click here for ABCD2, HEART and other calculatorsREFRESH Note before signing :1}                              Medical Decision Making Amount and/or Complexity of Data Reviewed Radiology: ordered.  Risk Prescription drug management.   Patient with fracture distal left wrist which is closed.  She is given a sugar-tong splint and pain medicines and will follow-up with Ortho  {Document critical care time when appropriate:1} {Document review of labs and clinical decision tools ie heart  score, Chads2Vasc2 etc:1}  {Document your independent review of radiology images, and any outside records:1} {Document your discussion with family members, caretakers, and with consultants:1} {Document social determinants of health affecting pt's care:1} {Document your decision making why or why not admission, treatments were needed:1} Final Clinical Impression(s) / ED Diagnoses Final diagnoses:  Closed fracture of left wrist, initial encounter    Rx / DC Orders ED Discharge Orders          Ordered    oxyCODONE -acetaminophen  (PERCOCET/ROXICET) 5-325 MG tablet        01/29/24 1345

## 2024-01-29 NOTE — ED Triage Notes (Signed)
 Pt to Er states she tripped on a box and fell backward, catching herself on her left wrist.  Pt with noted swelling and deformity.  PMS intact.

## 2024-01-29 NOTE — Discharge Instructions (Addendum)
 Keep your arm elevated is much as possible.  Follow-up with either [facility] or you have also been referred to Dr. Marce Sensing if [facility] cannot see you

## 2024-01-30 ENCOUNTER — Telehealth: Payer: Self-pay | Admitting: Orthopedic Surgery

## 2024-01-30 NOTE — Telephone Encounter (Signed)
 Called patient and spoke to both her and husband regarding appointment with Dr. Merlinda Starling to follow up on wrist fracture she was seen in ER for. Patients husband stated she is a current patient at [facility] and would like to stay there for treatment. They plan on calling the office first thing tomorrow morning to see if they can be seen by someone there. If they are unable to make an appointment with them, they plan on calling our office back for an appointment. If they do decide to come to [facility], they need to be seen either Monday or Tuesday of this week for possible surgical consultation.  I informed Dr. Merlinda Starling of the patients preference.

## 2024-01-31 DIAGNOSIS — S52502A Unspecified fracture of the lower end of left radius, initial encounter for closed fracture: Secondary | ICD-10-CM | POA: Diagnosis not present

## 2024-02-02 ENCOUNTER — Other Ambulatory Visit: Payer: Self-pay

## 2024-02-02 ENCOUNTER — Encounter (HOSPITAL_BASED_OUTPATIENT_CLINIC_OR_DEPARTMENT_OTHER): Payer: Self-pay | Admitting: Orthopedic Surgery

## 2024-02-09 DIAGNOSIS — Z01818 Encounter for other preprocedural examination: Secondary | ICD-10-CM

## 2024-02-15 NOTE — Anesthesia Preprocedure Evaluation (Signed)
 Anesthesia Evaluation  Patient identified by MRN, date of birth, ID band Patient awake    Reviewed: Allergy & Precautions, NPO status , Patient's Chart, lab work & pertinent test results, reviewed documented beta blocker date and time   History of Anesthesia Complications Negative for: history of anesthetic complications  Airway Mallampati: II  TM Distance: >3 FB Neck ROM: Full    Dental  (+) Missing,    Pulmonary neg pulmonary ROS   Pulmonary exam normal        Cardiovascular hypertension, Pt. on home beta blockers and Pt. on medications + CAD  Normal cardiovascular exam     Neuro/Psych negative neurological ROS     GI/Hepatic Neg liver ROS,GERD  Medicated,,  Endo/Other  Hypothyroidism    Renal/GU negative Renal ROS  negative genitourinary   Musculoskeletal Left distal radius fracture   Abdominal   Peds  Hematology negative hematology ROS (+)   Anesthesia Other Findings Day of surgery medications reviewed with patient.  Reproductive/Obstetrics                              Anesthesia Physical Anesthesia Plan  ASA: 2  Anesthesia Plan: Regional and MAC   Post-op Pain Management: Minimal or no pain anticipated   Induction:   PONV Risk Score and Plan: 2 and Treatment may vary due to age or medical condition, Propofol  infusion, Ondansetron  and Midazolam   Airway Management Planned: Natural Airway and Simple Face Mask  Additional Equipment: None  Intra-op Plan:   Post-operative Plan:   Informed Consent: I have reviewed the patients History and Physical, chart, labs and discussed the procedure including the risks, benefits and alternatives for the proposed anesthesia with the patient or authorized representative who has indicated his/her understanding and acceptance.       Plan Discussed with: CRNA  Anesthesia Plan Comments:          Anesthesia Quick Evaluation

## 2024-02-16 ENCOUNTER — Encounter (HOSPITAL_BASED_OUTPATIENT_CLINIC_OR_DEPARTMENT_OTHER): Payer: Self-pay | Admitting: Orthopedic Surgery

## 2024-02-16 ENCOUNTER — Ambulatory Visit (HOSPITAL_BASED_OUTPATIENT_CLINIC_OR_DEPARTMENT_OTHER): Payer: Self-pay | Admitting: Anesthesiology

## 2024-02-16 ENCOUNTER — Other Ambulatory Visit: Payer: Self-pay

## 2024-02-16 ENCOUNTER — Encounter (HOSPITAL_BASED_OUTPATIENT_CLINIC_OR_DEPARTMENT_OTHER)
Admission: RE | Disposition: A | Discharge status: Home / Self Care | Payer: Self-pay | Source: Home / Self Care | Attending: Orthopedic Surgery

## 2024-02-16 ENCOUNTER — Ambulatory Visit (HOSPITAL_BASED_OUTPATIENT_CLINIC_OR_DEPARTMENT_OTHER)
Admission: RE | Admit: 2024-02-16 | Discharge: 2024-02-16 | Disposition: A | Discharge status: Home / Self Care | Attending: Orthopedic Surgery | Admitting: Orthopedic Surgery

## 2024-02-16 ENCOUNTER — Ambulatory Visit (HOSPITAL_BASED_OUTPATIENT_CLINIC_OR_DEPARTMENT_OTHER)

## 2024-02-16 DIAGNOSIS — I2510 Atherosclerotic heart disease of native coronary artery without angina pectoris: Secondary | ICD-10-CM | POA: Insufficient documentation

## 2024-02-16 DIAGNOSIS — E039 Hypothyroidism, unspecified: Secondary | ICD-10-CM | POA: Diagnosis not present

## 2024-02-16 DIAGNOSIS — X58XXXA Exposure to other specified factors, initial encounter: Secondary | ICD-10-CM | POA: Diagnosis not present

## 2024-02-16 DIAGNOSIS — S52572A Other intraarticular fracture of lower end of left radius, initial encounter for closed fracture: Secondary | ICD-10-CM | POA: Insufficient documentation

## 2024-02-16 DIAGNOSIS — I10 Essential (primary) hypertension: Secondary | ICD-10-CM | POA: Insufficient documentation

## 2024-02-16 DIAGNOSIS — S52502A Unspecified fracture of the lower end of left radius, initial encounter for closed fracture: Secondary | ICD-10-CM | POA: Diagnosis not present

## 2024-02-16 DIAGNOSIS — K219 Gastro-esophageal reflux disease without esophagitis: Secondary | ICD-10-CM | POA: Diagnosis not present

## 2024-02-16 DIAGNOSIS — Z01818 Encounter for other preprocedural examination: Secondary | ICD-10-CM

## 2024-02-16 DIAGNOSIS — Z79899 Other long term (current) drug therapy: Secondary | ICD-10-CM | POA: Insufficient documentation

## 2024-02-16 HISTORY — DX: Cardiac arrhythmia, unspecified: I49.9

## 2024-02-16 HISTORY — DX: Gastro-esophageal reflux disease without esophagitis: K21.9

## 2024-02-16 HISTORY — DX: Unspecified fracture of the lower end of left radius, initial encounter for closed fracture: S52.502A

## 2024-02-16 HISTORY — PX: OPEN REDUCTION INTERNAL FIXATION (ORIF) DISTAL RADIAL FRACTURE: SHX5989

## 2024-02-16 SURGERY — OPEN REDUCTION INTERNAL FIXATION (ORIF) DISTAL RADIUS FRACTURE
Anesthesia: Monitor Anesthesia Care | Site: Wrist | Laterality: Left

## 2024-02-16 MED ORDER — DROPERIDOL 2.5 MG/ML IJ SOLN: 0.6250 mg | Freq: Once | INTRAMUSCULAR | Status: DC | PRN

## 2024-02-16 MED ORDER — OXYCODONE HCL 5 MG/5ML PO SOLN: 5.0000 mg | Freq: Once | ORAL | Status: DC | PRN

## 2024-02-16 MED ORDER — DEXMEDETOMIDINE HCL IN NACL 80 MCG/20ML IV SOLN
INTRAVENOUS | Status: AC
  Filled 2024-02-16: qty 20

## 2024-02-16 MED ORDER — PROPOFOL 500 MG/50ML IV EMUL
INTRAVENOUS | Status: DC | PRN
  Administered 2024-02-16: 100 ug/kg/min via INTRAVENOUS

## 2024-02-16 MED ORDER — BUPIVACAINE-EPINEPHRINE (PF) 0.5% -1:200000 IJ SOLN
INTRAMUSCULAR | Status: DC | PRN
  Administered 2024-02-16: 30 mL via PERINEURAL

## 2024-02-16 MED ORDER — FENTANYL CITRATE (PF) 100 MCG/2ML IJ SOLN: 25.0000 ug | INTRAMUSCULAR | Status: DC | PRN

## 2024-02-16 MED ORDER — DEXAMETHASONE SODIUM PHOSPHATE 10 MG/ML IJ SOLN
INTRAMUSCULAR | Status: AC
  Filled 2024-02-16: qty 1

## 2024-02-16 MED ORDER — CEFAZOLIN SODIUM-DEXTROSE 2-4 GM/100ML-% IV SOLN
INTRAVENOUS | Status: AC
  Filled 2024-02-16: qty 100

## 2024-02-16 MED ORDER — CEFAZOLIN SODIUM-DEXTROSE 2-4 GM/100ML-% IV SOLN
2.0000 g | INTRAVENOUS | Status: AC
  Administered 2024-02-16: 2 g via INTRAVENOUS

## 2024-02-16 MED ORDER — ONDANSETRON HCL 4 MG/2ML IJ SOLN
INTRAMUSCULAR | Status: AC
  Filled 2024-02-16: qty 4

## 2024-02-16 MED ORDER — PHENYLEPHRINE 80 MCG/ML (10ML) SYRINGE FOR IV PUSH (FOR BLOOD PRESSURE SUPPORT)
PREFILLED_SYRINGE | INTRAVENOUS | Status: AC
  Filled 2024-02-16: qty 10

## 2024-02-16 MED ORDER — LIDOCAINE 2% (20 MG/ML) 5 ML SYRINGE
INTRAMUSCULAR | Status: AC
  Filled 2024-02-16: qty 10

## 2024-02-16 MED ORDER — FENTANYL CITRATE (PF) 100 MCG/2ML IJ SOLN
INTRAMUSCULAR | Status: AC
  Filled 2024-02-16: qty 2

## 2024-02-16 MED ORDER — ONDANSETRON HCL 4 MG/2ML IJ SOLN
INTRAMUSCULAR | Status: DC | PRN
  Administered 2024-02-16: 4 mg via INTRAVENOUS

## 2024-02-16 MED ORDER — OXYCODONE HCL 5 MG PO TABS: 5.0000 mg | ORAL_TABLET | Freq: Four times a day (QID) | ORAL | 0 refills | Status: AC | PRN

## 2024-02-16 MED ORDER — EPHEDRINE 5 MG/ML INJ
INTRAVENOUS | Status: AC
  Filled 2024-02-16: qty 5

## 2024-02-16 MED ORDER — MIDAZOLAM HCL 2 MG/2ML IJ SOLN
INTRAMUSCULAR | Status: AC
  Filled 2024-02-16: qty 2

## 2024-02-16 MED ORDER — MIDAZOLAM HCL 2 MG/2ML IJ SOLN
1.0000 mg | Freq: Once | INTRAMUSCULAR | Status: AC
  Administered 2024-02-16: 1 mg via INTRAVENOUS

## 2024-02-16 MED ORDER — ROCURONIUM BROMIDE 10 MG/ML (PF) SYRINGE
PREFILLED_SYRINGE | INTRAVENOUS | Status: AC
  Filled 2024-02-16: qty 10

## 2024-02-16 MED ORDER — OXYCODONE HCL 5 MG PO TABS: 5.0000 mg | ORAL_TABLET | Freq: Once | ORAL | Status: DC | PRN

## 2024-02-16 MED ORDER — PHENYLEPHRINE HCL-NACL 20-0.9 MG/250ML-% IV SOLN
INTRAVENOUS | Status: DC | PRN
  Administered 2024-02-16: 40 ug/min via INTRAVENOUS

## 2024-02-16 MED ORDER — PHENYLEPHRINE HCL (PRESSORS) 10 MG/ML IV SOLN
INTRAVENOUS | Status: AC
  Filled 2024-02-16: qty 1

## 2024-02-16 MED ORDER — FENTANYL CITRATE (PF) 100 MCG/2ML IJ SOLN
100.0000 ug | Freq: Once | INTRAMUSCULAR | Status: AC
  Administered 2024-02-16: 100 ug via INTRAVENOUS

## 2024-02-16 MED ORDER — CLONIDINE HCL (ANALGESIA) 100 MCG/ML EP SOLN
EPIDURAL | Status: DC | PRN
  Administered 2024-02-16: 100 ug

## 2024-02-16 MED ORDER — PROPOFOL 500 MG/50ML IV EMUL
INTRAVENOUS | Status: AC
  Filled 2024-02-16: qty 50

## 2024-02-16 MED ORDER — PROPOFOL 1000 MG/100ML IV EMUL
INTRAVENOUS | Status: AC
  Filled 2024-02-16: qty 400

## 2024-02-16 MED ORDER — PHENYLEPHRINE HCL (PRESSORS) 10 MG/ML IV SOLN
INTRAVENOUS | Status: DC | PRN
  Administered 2024-02-16: 80 ug via INTRAVENOUS
  Administered 2024-02-16: 160 ug via INTRAVENOUS
  Administered 2024-02-16 (×3): 80 ug via INTRAVENOUS

## 2024-02-16 MED ORDER — ATROPINE SULFATE 0.4 MG/ML IV SOLN
INTRAVENOUS | Status: AC
  Filled 2024-02-16: qty 1

## 2024-02-16 MED ORDER — EPHEDRINE SULFATE (PRESSORS) 50 MG/ML IJ SOLN
INTRAMUSCULAR | Status: DC | PRN
  Administered 2024-02-16: 5 mg via INTRAVENOUS

## 2024-02-16 MED ORDER — LACTATED RINGERS IV SOLN: INTRAVENOUS | Status: DC

## 2024-02-16 SURGICAL SUPPLY — 42 items
BIT DRILL SOLID 2.0X40MM (BIT) IMPLANT
BIT DRILL SOLID 2.5X40MM (BIT) IMPLANT
BLADE SURG 15 STRL LF DISP TIS (BLADE) ×1 IMPLANT
BNDG ELASTIC 3INX 5YD STR LF (GAUZE/BANDAGES/DRESSINGS) ×1 IMPLANT
BNDG ESMARK 4X9 LF (GAUZE/BANDAGES/DRESSINGS) ×1 IMPLANT
BNDG GAUZE DERMACEA FLUFF 4 (GAUZE/BANDAGES/DRESSINGS) ×1 IMPLANT
CHLORAPREP W/TINT 26 (MISCELLANEOUS) ×1 IMPLANT
CORD BIPOLAR FORCEPS 12FT (ELECTRODE) ×1 IMPLANT
COVER BACK TABLE 60X90IN (DRAPES) ×1 IMPLANT
CUFF TOURN SGL QUICK 18X4 (TOURNIQUET CUFF) ×1 IMPLANT
CUFF TRNQT CYL 24X4X16.5-23 (TOURNIQUET CUFF) IMPLANT
DRAPE EXTREMITY T 121X128X90 (DISPOSABLE) ×1 IMPLANT
DRAPE OEC MINIVIEW 54X84 (DRAPES) ×1 IMPLANT
DRAPE SURG 17X23 STRL (DRAPES) ×1 IMPLANT
GAUZE SPONGE 4X4 12PLY STRL (GAUZE/BANDAGES/DRESSINGS) ×1 IMPLANT
GAUZE XEROFORM 1X8 LF (GAUZE/BANDAGES/DRESSINGS) IMPLANT
GLOVE BIO SURGEON STRL SZ7 (GLOVE) ×1 IMPLANT
GOWN STRL REUS W/ TWL LRG LVL3 (GOWN DISPOSABLE) ×2 IMPLANT
GUIDE AIMING 1.5MM (WIRE) IMPLANT
NDL HYPO 25X1 1.5 SAFETY (NEEDLE) IMPLANT
NEEDLE HYPO 25X1 1.5 SAFETY (NEEDLE) ×1 IMPLANT
NS IRRIG 1000ML POUR BTL (IV SOLUTION) ×1 IMPLANT
PACK BASIN DAY SURGERY FS (CUSTOM PROCEDURE TRAY) ×1 IMPLANT
PAD CAST 3X4 CTTN HI CHSV (CAST SUPPLIES) ×1 IMPLANT
PEG GEMINUS SMOOTH LOCK 2.0X17 (Peg) IMPLANT
PEG SMOOTH LOCK 2.0X18 (Screw) IMPLANT
PLATE LEFT NARROW 3H (Plate) IMPLANT
SCREW CORT LOCK 3.5X12 TI (Screw) IMPLANT
SCREW GEMINUS CORT LOCK 3.5X11 (Screw) IMPLANT
SCREW POLY NON LOCK 3.5MMX12MM (Screw) IMPLANT
SHEET MEDIUM DRAPE 40X70 STRL (DRAPES) ×1 IMPLANT
SLEEVE SCD COMPRESS KNEE MED (STOCKING) IMPLANT
SLING ARM FOAM STRAP LRG (SOFTGOODS) IMPLANT
SPLINT FIBERGLASS 4X30 (CAST SUPPLIES) IMPLANT
SUT ETHILON 4 0 PS 2 18 (SUTURE) ×1 IMPLANT
SUT MNCRL AB 3-0 PS2 18 (SUTURE) ×1 IMPLANT
SUT VIC AB 4-0 PS2 18 (SUTURE) IMPLANT
SYR BULB EAR ULCER 3OZ GRN STR (SYRINGE) ×1 IMPLANT
SYR CONTROL 10ML LL (SYRINGE) IMPLANT
TOWEL GREEN STERILE FF (TOWEL DISPOSABLE) ×2 IMPLANT
UNDERPAD 30X36 HEAVY ABSORB (UNDERPADS AND DIAPERS) ×1 IMPLANT
WIRE FIX 1.5 STD TIP (WIRE) IMPLANT

## 2024-02-16 NOTE — H&P (Signed)
 HAND SURGERY   HPI: Patient is a 73 y.o. female who presents with a closed, left distal radius fracture.  She presents today for open reduction and internal fixation.  Patient denies any changes to their medical history or new systemic symptoms today.    Past Medical History:  Diagnosis Date   CAD (coronary artery disease)    Distal radius fracture, left    Dysrhythmia    PALPITATIONS   GERD (gastroesophageal reflux disease)    Hyperlipidemia    Hypertension    Hypothyroid    Past Surgical History:  Procedure Laterality Date   laproscopic endometrial surgrey     ORIF ANKLE FRACTURE Right 07/17/2021   Procedure: OPEN REDUCTION INTERNAL FIXATION (ORIF) right ankle bimalleolar fracture;  Surgeon: Amada Backer, MD;  Location: [facility];  Service: Orthopedics;  Laterality: Right;    Social History   Socioeconomic History   Marital status: Married    Spouse name: Not on file   Number of children: 2   Years of education: Not on file   Highest education level: Not on file  Occupational History   Not on file  Tobacco Use   Smoking status: Never   Smokeless tobacco: Never  Vaping Use   Vaping status: Never Used  Substance and Sexual Activity   Alcohol  use: No   Drug use: Never   Sexual activity: Yes    Birth control/protection: Post-menopausal    Comment: ablation  Other Topics Concern   Not on file  Social History Narrative   Not on file   Social Drivers of Health   Financial Resource Strain: Not on file  Food Insecurity: Not on file  Transportation Needs: Not on file  Physical Activity: Not on file  Stress: Not on file  Social Connections: Not on file   Family History  Problem Relation Age of Onset   Heart attack Mother    Heart disease Mother    Stroke Mother    Diabetes Father    Heart failure Father    Breast cancer Neg Hx    - negative except otherwise stated in the family history section Allergies  Allergen Reactions   Statins  Other (See Comments)    Sensitivity to pain/aching joints   Aspirin      Can tolerate 81mg  daily, higher doses give indigestion    Naproxen Swelling and Other (See Comments)    Lips swell.   Prior to Admission medications   Medication Sig Start Date End Date Taking? Authorizing Provider  acetaminophen  (TYLENOL ) 500 MG tablet Take 500 mg by mouth as needed.   Yes [provider]  amLODipine  (NORVASC ) 5 MG tablet Take 5 mg by mouth daily.   Yes [provider]  aspirin  81 MG EC tablet take one a day 09/29/17  Yes [provider]  carvedilol  (COREG ) 12.5 MG tablet Take 1 tablet (12.5 mg total) by mouth 2 (two) times daily. 09/13/23  Yes Monge, Nelva Bang, NP  Cholecalciferol (VITAMIN D3) 50 MCG (2000 UT) capsule Take 2,000 Units by mouth daily.   Yes [provider]  Clobetasol Prop Emollient Base (CLOBETASOL PROPIONATE E EX) Apply topically as needed.   Yes [provider]  docusate sodium (COLACE) 100 MG capsule Take 100 mg by mouth 2 (two) times daily.   Yes [provider]  estradiol (ESTRACE) 0.1 MG/GM vaginal cream as needed.   Yes [provider]  famotidine (PEPCID) 20 MG tablet Take 20 mg by mouth 2 (  two) times daily.   Yes [provider]  inclisiran (LEQVIO ) 284 MG/1.5ML SOSY injection Inject 284 mg into the skin once.   Yes [provider]  levothyroxine (SYNTHROID) 88 MCG tablet Take 88 mcg by mouth daily before breakfast.   Yes [provider]  oxyCODONE -acetaminophen  (PERCOCET/ROXICET) 5-325 MG tablet Take 1 every 6 hours for pain not relieved by Tylenol  or Motrin alone 01/29/24  Yes Zammit, Joseph, MD  meclizine  (ANTIVERT ) 25 MG tablet Take 1 tablet (25 mg total) by mouth 4 (four) times daily as needed for dizziness. 07/26/20   Jhonnie Mosher, PA-C  rosuvastatin  (CRESTOR ) 5 MG tablet TAKE 1 TABLET (5 MG TOTAL) BY MOUTH DAILY. Patient taking differently: Take 5 mg by mouth daily. TAKES ON MONDAYS  AND FRIDAY 11/12/23   Lenise Quince, MD   No results found. - Positive ROS: All other systems have been reviewed and were otherwise negative with the exception of those mentioned in the HPI and as above.  Physical Exam: General: No acute distress, resting comfortably Cardiovascular: BUE warm and well perfused, normal rate Respiratory: Normal WOB on RA Skin: Warm and dry Neurologic: Sensation intact distally Psychiatric: Patient is at baseline mood and affect  Left Upper Extremity  Splint is clean and dry.  Mildly limited AROM of fingers secondary to pain and swelling.  SILT m/u/r distributions.  Hand warm and well perfused w/ BCR.    Assessment: 73 yo F w/ closed, left distal radius fracture  Plan: OR today for ORIF distal radius.  We had a lengthy discussion regarding the risks of surgery which include bleeding, infection, damage to neurovascular structures, persistent symptoms, nonunion, malnunion, stiffness, need for additional surgery.    Marilyn Shropshire, M.D. [facility] 7:16 AM

## 2024-02-16 NOTE — Anesthesia Postprocedure Evaluation (Signed)
 Anesthesia Post Note  Patient: Casey Kramer  Procedure(s) Performed: OPEN REDUCTION INTERNAL FIXATION (ORIF) DISTAL RADIUS FRACTURE (Left: Wrist)     Patient location during evaluation: PACU Anesthesia Type: Regional and MAC Level of consciousness: awake and alert Pain management: pain level controlled Vital Signs Assessment: post-procedure vital signs reviewed and stable Respiratory status: spontaneous breathing, nonlabored ventilation and respiratory function stable Cardiovascular status: blood pressure returned to baseline Postop Assessment: no apparent nausea or vomiting Anesthetic complications: no   No notable events documented.  Last Vitals:  Vitals:   02/16/24 0950 02/16/24 1000  BP: 139/76 131/68  Pulse: 73 69  Resp: 12 15  Temp: (!) 36.3 C   SpO2: 94% 93%    Last Pain:  Vitals:   02/16/24 0714  TempSrc: Temporal  PainSc: 0-No pain                 Rayfield Cairo

## 2024-02-16 NOTE — Interval H&P Note (Signed)
 History and Physical Interval Note:  02/16/2024 8:22 AM  Casey Kramer  has presented today for surgery, with the diagnosis of Left distal radius fracture.  The various methods of treatment have been discussed with the patient and family. After consideration of risks, benefits and other options for treatment, the patient has consented to  Procedure(s): OPEN REDUCTION INTERNAL FIXATION (ORIF) DISTAL RADIUS FRACTURE (Left) as a surgical intervention.  The patient's history has been reviewed, patient examined, no change in status, stable for surgery.  I have reviewed the patient's chart and labs.  Questions were answered to the patient's satisfaction.     Dominque Marlin

## 2024-02-16 NOTE — Op Note (Signed)
 Date of Surgery: 02/16/2024  INDICATIONS: Patient is a 73 y.o.-year-old female with a closed, left intra-articular distal radius fracture.  We reviewed the nature of her injury at length in the office including both conservative and surgical treatment options.  Given her high activity level, patient elected to proceed with surgical management of her injury.  Risks, benefits, and alternatives to surgery were again discussed with the patient in the preoperative area. The patient wishes to proceed with surgery.  Informed consent was signed after our discussion.   PREOPERATIVE DIAGNOSIS:  Left, intra-articular distal radius fracture, 3+ fragments  POSTOPERATIVE DIAGNOSIS: Same.  PROCEDURE:  Open reduction and internal fixation of left distal radius fracture, >3 fragments Left brachioradialis tenotomy   SURGEON: Auburn Blaze, M.D.  ASSIST: None  ANESTHESIA:  Regional, MAC  IV FLUIDS AND URINE: See anesthesia.  ESTIMATED BLOOD LOSS: 10 mL.  IMPLANTS:  Implant Name Type Inv. Item Serial No. Manufacturer Lot No. LRB No. Used Action  SCREW POLY NON LOCK [id number] - [id number] Screw SCREW POLY NON LOCK [id number]  SKELETAL DYNAMICS  Left 1 Implanted  PLATE LEFT NARROW 3H - [id number] Plate PLATE LEFT NARROW 3H  SKELETAL DYNAMICS  Left 1 Implanted  PEG GEMINUS SMOOTH LOCK 2.0X17 - [id number] Peg PEG GEMINUS SMOOTH LOCK 2.0X17  SKELETAL DYNAMICS  Left 1 Implanted  PEG SMOOTH LOCK 2.0X18 - [id number] Screw PEG SMOOTH LOCK 2.0X18  SKELETAL DYNAMICS  Left 5 Implanted  SCREW GEMINUS CORT LOCK 3.5X11 - [id number] Screw SCREW GEMINUS CORT LOCK 3.5X11  SKELETAL DYNAMICS  Left 1 Implanted  SCREW CORT LOCK 3.5X12 TI - [id number] Screw SCREW CORT LOCK 3.5X12 TI  SKELETAL DYNAMICS  Left 1 Implanted     DRAINS: None  COMPLICATIONS: None  DESCRIPTION OF PROCEDURE:   The patient was met in the preoperative holding area where the surgical site was marked and the consent form was signed. The  patient was brought to the operating room and remained on the stretcher.  A hand table was placed adjacent to the operative extremity and locked into place.  All bony prominences were well-padded.  Monitored anesthesia was induced.  A tourniquet was placed high on the left upper arm.  The left upper extremity was then prepped and draped in the usual and sterile fashion.  A formal timeout was performed to confirm that this is the correct patient, surgical side, surgical site, and surgical procedure.  All necessary implants were in the room.  Following formal timeout, a longitudinal incision was made directly overlying the flexor carpi radialis tendon.  The skin was incised.  Small crossing vessels were coagulated with bipolar cautery.  The volar aspect of the flexor carpi radialis tendon sheath was incised longitudinally.  The FCR tendon was retracted radially.  The floor of the FCR tendon sheath was similarly divided using a 15 blade scalpel.  Blunt dissection was used to develop Parona space between the flexor pollicis longus and pronator quadratus.  The flexor pollicis longus was retracted ulnarly.  An L-shaped tenotomy was performed along the distal and radial aspect of the pronator quadratus tendon.  The pronator quadratus was subperiosteally dissected off of the volar aspect of the radius and the distal fracture fragments using an elevator.  Blunt dissection was used to identify the brachioradialis tendon as it inserted on the radial aspect of the distal radius.  The underlying first dorsal compartment tendons were identified and protected.  A tenotomy of the brachioradialis was performed to aid in reduction of the fracture.  A curved periosteal elevator was used around the radial aspect of the distal radius to break up early callus formation and thickened periosteum at the dorsal apsect of the metaphysis to allow for anatomic reduction of the fracture.  The fracture was reduced with gentle manipulation.  A 3  hole narrow plate was selected and placed on the volar surface of the radius.  It was fixed with the center of the oblong hole in the shaft with a bicortical nonlocking screw.  2 K wires were placed in the aiming guides of the plate.  An AP and lateral view showed that the plate was in appropriate position and the K wires were in a good, subchondral location.  The distal holes of the plate were then filled with smooth locking pegs taking care that the pegs did not penetrate the dorsal cortex.  The 2 remaining holes in the shaft of the plate were then filled with bicortical locking screws.  AP, lateral, and 30 degree lateral views at this point showed that the fracture was anatomically reduced.  There was restoration of radial height, inclination, and volar tilt.  The articular surface was congruent.  All of the locking screws in the distal portion of the plate were subchondral.  The DRUJ was examined and found to be stable.  At this point, the wound was thoroughly irrigated with copious sterile saline.  The pronator quadratus was repaired using a 4-0 Vicryl suture.  The skin was closed initially with a 3-0 Monocryl suture in sparse buried interrupted fashion.  The tourniquet was deflated.  Hemostasis was achieved with bipolar electrocautery and with compression on the wound.  The skin was then closed using a 4-0 nylon suture in horizontal mattress fashion.  The fingers were pink and well-perfused with brisk capillary refill.  The wound was then dressed with Xeroform, folded Kerlix, cast padding, and a well-padded volar splint.   The patient was reversed from sedation.  All counts were correct x 2 at the end of the procedure.  The patient was then taken to the PACU in stable condition.   POSTOPERATIVE PLAN: She will be discharged to home with appropriate pain medication and discharge instructions.  A referral has been placed to hand therapy.  I will see her back in the office in 10 to 14 days for her first  postoperative visit.  Auburn Blaze, MD 9:53 AM

## 2024-02-16 NOTE — Progress Notes (Signed)
Assisted Dr. Daiva Huge with left, supraclavicular, ultrasound guided block. Side rails up, monitors on throughout procedure. See vital signs in flow sheet. Tolerated Procedure well. ?

## 2024-02-16 NOTE — Transfer of Care (Signed)
 Immediate Anesthesia Transfer of Care Note  Patient: Casey Kramer  Procedure(s) Performed: OPEN REDUCTION INTERNAL FIXATION (ORIF) DISTAL RADIUS FRACTURE (Left: Wrist)  Patient Location: PACU  Anesthesia Type:MAC and Regional  Level of Consciousness: awake, alert , oriented, and patient cooperative  Airway & Oxygen Therapy: Patient Spontanous Breathing  Post-op Assessment: Report given to RN and Post -op Vital signs reviewed and stable  Post vital signs: Reviewed and stable  Last Vitals:  Vitals Value Taken Time  BP 139/76 02/16/24 0950  Temp 36.3 C 02/16/24 0950  Pulse 73 02/16/24 0951  Resp 11 02/16/24 0951  SpO2 94 % 02/16/24 0951  Vitals shown include unfiled device data.  Last Pain:  Vitals:   02/16/24 0714  TempSrc: Temporal  PainSc: 0-No pain         Complications: No notable events documented.

## 2024-02-16 NOTE — Discharge Instructions (Addendum)
 Post Anesthesia Home Care Instructions  Activity: Get plenty of rest for the remainder of the day. A responsible individual must stay with you for 24 hours following the procedure.  For the next 24 hours, DO NOT: -Drive a car -Advertising copywriter -Drink alcoholic beverages -Take any medication unless instructed by your physician -Make any legal decisions or sign important papers.  Meals: Start with liquid foods such as gelatin or soup. Progress to regular foods as tolerated. Avoid greasy, spicy, heavy foods. If nausea and/or vomiting occur, drink only clear liquids until the nausea and/or vomiting subsides. Call your physician if vomiting continues.  Special Instructions/Symptoms: Your throat may feel dry or sore from the anesthesia or the breathing tube placed in your throat during surgery. If this causes discomfort, gargle with warm salt water. The discomfort should disappear within 24 hours.  If you had a scopolamine patch placed behind your ear for the management of post- operative nausea and/or vomiting:  1. The medication in the patch is effective for 72 hours, after which it should be removed.  Wrap patch in a tissue and discard in the trash. Wash hands thoroughly with soap and water. 2. You may remove the patch earlier than 72 hours if you experience unpleasant side effects which may include dry mouth, dizziness or visual disturbances. 3. Avoid touching the patch. Wash your hands with soap and water after contact with the patch.     Regional Anesthesia Blocks  1. You may not be able to move or feel the "blocked" extremity after a regional anesthetic block. This may last may last from 3-48 hours after placement, but it will go away. The length of time depends on the medication injected and your individual response to the medication. As the nerves start to wake up, you may experience tingling as the movement and feeling returns to your extremity. If the numbness and inability to move  your extremity has not gone away after 48 hours, please call your surgeon.   2. The extremity that is blocked will need to be protected until the numbness is gone and the strength has returned. Because you cannot feel it, you will need to take extra care to avoid injury. Because it may be weak, you may have difficulty moving it or using it. You may not know what position it is in without looking at it while the block is in effect.  3. For blocks in the legs and feet, returning to weight bearing and walking needs to be done carefully. You will need to wait until the numbness is entirely gone and the strength has returned. You should be able to move your leg and foot normally before you try and bear weight or walk. You will need someone to be with you when you first try to ensure you do not fall and possibly risk injury.  4. Bruising and tenderness at the needle site are common side effects and will resolve in a few days.  5. Persistent numbness or new problems with movement should be communicated to the surgeon or the [facility] [phone] [facility] [phone]).     Casey Kramer, M.D. Hand Surgery  POST-OPERATIVE DISCHARGE INSTRUCTIONS   PRESCRIPTIONS: You may have been given a prescription to be taken as directed for post-operative pain control.  You may also take over the counter ibuprofen/aleve and tylenol  for pain. Take this as directed on the packaging. Do not exceed 3000 mg tylenol /acetaminophen  in 24 hours.  Ibuprofen 600-800 mg (  3-4) tablets by mouth every 6 hours as needed for pain.  OR Aleve 2 tablets by mouth every 12 hours (twice daily) as needed for pain.  AND/OR Tylenol  1000 mg (2 tablets) every 8 hours as needed for pain.  Please use your pain medication carefully, as refills are limited and you may not be provided with one.  As stated above, please use over the counter pain medicine - it will also be helpful with decreasing your  swelling.    ANESTHESIA: After your surgery, post-surgical discomfort or pain is likely. This discomfort can last several days to a few weeks. At certain times of the day your discomfort may be more intense.   Did you receive a nerve block?  A nerve block can provide pain relief for one hour to two days after your surgery. As long as the nerve block is working, you will experience little or no sensation in the area the surgeon operated on.  As the nerve block wears off, you will begin to experience pain or discomfort. It is very important that you begin taking your prescribed pain medication before the nerve block fully wears off. Treating your pain at the first sign of the block wearing off will ensure your pain is better controlled and more tolerable when full-sensation returns. Do not wait until the pain is intolerable, as the medicine will be less effective. It is better to treat pain in advance than to try and catch up.   General Anesthesia:  If you did not receive a nerve block during your surgery, you will need to start taking your pain medication shortly after your surgery and should continue to do so as prescribed by your surgeon.     ICE AND ELEVATION: You may use ice for the first 48-72 hours, but it is not critical.   Motion of your fingers is very important to decrease the swelling.  Elevation, as much as possible for the next 48 hours, is critical for decreasing swelling as well as for pain relief. Elevation means when you are seated or lying down, you hand should be at or above your heart. When walking, the hand needs to be at or above the level of your elbow.  If the bandage gets too tight, it may need to be loosened. Please contact our office and we will instruct you in how to do this.    SURGICAL BANDAGES:  Keep your dressing and/or splint clean and dry at all times.  Do not remove until you are seen again in the office.  If careful, you may place a plastic bag over your  bandage and tape the end to shower, but be careful, do not get your bandages wet.     HAND THERAPY:  You will be contacted to set up your first therapy visit.     ACTIVITY AND WORK: You are encouraged to move any fingers which are not in the bandage.  Light use of the fingers is allowed to assist the other hand with daily hygiene and eating, but strong gripping or lifting is often uncomfortable and should be avoided.  You might miss a variable period of time from work and hopefully this issue has been discussed prior to surgery. You may not do any heavy work with your affected hand for about 2 weeks.    [facility] Second Floor, [address] [place], [place] [address] [phone]

## 2024-02-16 NOTE — Anesthesia Procedure Notes (Signed)
 Anesthesia Regional Block: Supraclavicular block   Pre-Anesthetic Checklist: , timeout performed,  Correct Patient, Correct Site, Correct Laterality,  Correct Procedure, Correct Position, site marked,  Risks and benefits discussed,  Pre-op evaluation,  At surgeon's request and post-op pain management  Laterality: Left  Prep: Maximum Sterile Barrier Precautions used, chloraprep       Needles:  Injection technique: Single-shot  Needle Type: Echogenic Stimulator Needle     Needle Length: 9cm  Needle Gauge: 22     Additional Needles:   Procedures:,,,, ultrasound used (permanent image in chart),,    Narrative:  Start time: 02/16/2024 8:03 AM End time: 02/16/2024 8:06 AM Injection made incrementally with aspirations every 5 mL.  Performed by: Personally  Anesthesiologist: Vernadine Golas, MD  Additional Notes: Risks, benefits, and alternative discussed. Patient gave consent for procedure. Patient prepped and draped in sterile fashion. Sedation administered, patient remains easily responsive to voice. Relevant anatomy identified with ultrasound guidance. Local anesthetic given in 5cc increments with no signs or symptoms of intravascular injection. No pain or paraesthesias with injection. Patient monitored throughout procedure with signs of LAST or immediate complications. Tolerated well. Ultrasound image placed in chart.  Amador Junes, MD

## 2024-02-17 ENCOUNTER — Encounter (HOSPITAL_BASED_OUTPATIENT_CLINIC_OR_DEPARTMENT_OTHER): Payer: Self-pay | Admitting: Orthopedic Surgery

## 2024-02-23 DIAGNOSIS — M25642 Stiffness of left hand, not elsewhere classified: Secondary | ICD-10-CM | POA: Diagnosis not present

## 2024-02-23 DIAGNOSIS — M25632 Stiffness of left wrist, not elsewhere classified: Secondary | ICD-10-CM | POA: Diagnosis not present

## 2024-02-28 ENCOUNTER — Ambulatory Visit: Payer: Medicare Other

## 2024-03-01 DIAGNOSIS — M25642 Stiffness of left hand, not elsewhere classified: Secondary | ICD-10-CM | POA: Diagnosis not present

## 2024-03-01 DIAGNOSIS — M25632 Stiffness of left wrist, not elsewhere classified: Secondary | ICD-10-CM | POA: Diagnosis not present

## 2024-03-03 DIAGNOSIS — S52502A Unspecified fracture of the lower end of left radius, initial encounter for closed fracture: Secondary | ICD-10-CM | POA: Diagnosis not present

## 2024-03-03 DIAGNOSIS — S52502D Unspecified fracture of the lower end of left radius, subsequent encounter for closed fracture with routine healing: Secondary | ICD-10-CM | POA: Diagnosis not present

## 2024-03-07 ENCOUNTER — Ambulatory Visit (INDEPENDENT_AMBULATORY_CARE_PROVIDER_SITE_OTHER)

## 2024-03-07 VITALS — BP 146/85 | HR 64 | Temp 98.1°F | Resp 16 | Ht 61.0 in | Wt 159.6 lb

## 2024-03-07 DIAGNOSIS — E7849 Other hyperlipidemia: Secondary | ICD-10-CM

## 2024-03-07 DIAGNOSIS — M25632 Stiffness of left wrist, not elsewhere classified: Secondary | ICD-10-CM | POA: Diagnosis not present

## 2024-03-07 DIAGNOSIS — M25642 Stiffness of left hand, not elsewhere classified: Secondary | ICD-10-CM | POA: Diagnosis not present

## 2024-03-07 MED ORDER — INCLISIRAN SODIUM 284 MG/1.5ML ~~LOC~~ SOSY
284.0000 mg | PREFILLED_SYRINGE | Freq: Once | SUBCUTANEOUS | Status: AC
  Administered 2024-03-07: 284 mg via SUBCUTANEOUS
  Filled 2024-03-07: qty 1.5

## 2024-03-07 NOTE — Progress Notes (Signed)
 Diagnosis: Hyperlipidemia  Provider:  Mannam, Praveen MD  Procedure: Injection  Leqvio  (inclisiran), Dose: 284 mg, Site: subcutaneous, Number of injections: 1  Injection Site(s): Right lower quad. abdomne  Post Care: Patient declined observation  Discharge: Condition: Good, Destination: Home . AVS Provided  Performed by:  Rachelle Bue, RN

## 2024-03-09 DIAGNOSIS — M25642 Stiffness of left hand, not elsewhere classified: Secondary | ICD-10-CM | POA: Diagnosis not present

## 2024-03-09 DIAGNOSIS — M25632 Stiffness of left wrist, not elsewhere classified: Secondary | ICD-10-CM | POA: Diagnosis not present

## 2024-03-12 ENCOUNTER — Other Ambulatory Visit: Payer: Self-pay | Admitting: Nurse Practitioner

## 2024-03-12 DIAGNOSIS — I10 Essential (primary) hypertension: Secondary | ICD-10-CM

## 2024-03-13 ENCOUNTER — Telehealth: Payer: Self-pay | Admitting: Pharmacist Clinician (PhC)/ Clinical Pharmacy Specialist

## 2024-03-13 DIAGNOSIS — E7849 Other hyperlipidemia: Secondary | ICD-10-CM

## 2024-03-13 NOTE — Telephone Encounter (Signed)
 Pt c/o medication issue:  1. Name of Medication: inclisiran (LEQVIO ) 284 MG/1.5ML SOSY injection   2. How are you currently taking this medication (dosage and times per day)? As written  3. Are you having a reaction (difficulty breathing--STAT)? No   4. What is your medication issue? Pt wants to know if UTI is a side effect of medication. Also pt wants to know when she is suppose to do labs

## 2024-03-14 DIAGNOSIS — M25632 Stiffness of left wrist, not elsewhere classified: Secondary | ICD-10-CM | POA: Diagnosis not present

## 2024-03-14 DIAGNOSIS — M25642 Stiffness of left hand, not elsewhere classified: Secondary | ICD-10-CM | POA: Diagnosis not present

## 2024-03-16 DIAGNOSIS — M25632 Stiffness of left wrist, not elsewhere classified: Secondary | ICD-10-CM | POA: Diagnosis not present

## 2024-03-16 DIAGNOSIS — M25642 Stiffness of left hand, not elsewhere classified: Secondary | ICD-10-CM | POA: Diagnosis not present

## 2024-03-16 NOTE — Telephone Encounter (Signed)
 Returned call to patient.  Since saw her she broke wrist and has been doing therapy.  Developed UTI around the same time.   No indication that this could be the cause.  Patient will get repeat cholesterol labs later this month.  Answered all questions.

## 2024-03-20 DIAGNOSIS — M25632 Stiffness of left wrist, not elsewhere classified: Secondary | ICD-10-CM | POA: Diagnosis not present

## 2024-03-20 DIAGNOSIS — M25642 Stiffness of left hand, not elsewhere classified: Secondary | ICD-10-CM | POA: Diagnosis not present

## 2024-03-23 DIAGNOSIS — M25632 Stiffness of left wrist, not elsewhere classified: Secondary | ICD-10-CM | POA: Diagnosis not present

## 2024-03-23 DIAGNOSIS — M25642 Stiffness of left hand, not elsewhere classified: Secondary | ICD-10-CM | POA: Diagnosis not present

## 2024-03-28 DIAGNOSIS — S52502D Unspecified fracture of the lower end of left radius, subsequent encounter for closed fracture with routine healing: Secondary | ICD-10-CM | POA: Diagnosis not present

## 2024-03-29 DIAGNOSIS — M25642 Stiffness of left hand, not elsewhere classified: Secondary | ICD-10-CM | POA: Diagnosis not present

## 2024-03-29 DIAGNOSIS — M25632 Stiffness of left wrist, not elsewhere classified: Secondary | ICD-10-CM | POA: Diagnosis not present

## 2024-03-29 MED ORDER — CARVEDILOL 12.5 MG PO TABS: 12.5000 mg | ORAL_TABLET | Freq: Two times a day (BID) | ORAL | 2 refills | Status: AC

## 2024-03-29 NOTE — Telephone Encounter (Signed)
 Patient is calling wanting to know why carvedilol  refill was denied. She reports she will be out of the medication on Saturday.   Patient would like a cb letting her know if unable to fill. Please advise.

## 2024-03-29 NOTE — Addendum Note (Signed)
 Addended by: Thaddeus Filippo on: 03/29/2024 05:14 PM   Modules accepted: Orders

## 2024-03-29 NOTE — Telephone Encounter (Signed)
 In review of medication refills, carvedilol  12.5 mg BID - #90 X3 was sent into pharmacy on 09/13/23.  Pt needs 180 tablets to last for 90 day supply.  Advised pt I will refill medication as directed.

## 2024-04-05 DIAGNOSIS — E7849 Other hyperlipidemia: Secondary | ICD-10-CM | POA: Diagnosis not present

## 2024-04-06 ENCOUNTER — Ambulatory Visit: Payer: Self-pay | Admitting: Pharmacist Clinician (PhC)/ Clinical Pharmacy Specialist

## 2024-04-06 DIAGNOSIS — M25632 Stiffness of left wrist, not elsewhere classified: Secondary | ICD-10-CM | POA: Diagnosis not present

## 2024-04-06 DIAGNOSIS — M25642 Stiffness of left hand, not elsewhere classified: Secondary | ICD-10-CM | POA: Diagnosis not present

## 2024-04-06 LAB — HEPATIC FUNCTION PANEL
ALT: 14 IU/L (ref 0–32)
AST: 11 IU/L (ref 0–40)
Albumin: 4.4 g/dL (ref 3.8–4.8)
Alkaline Phosphatase: 116 IU/L (ref 44–121)
Bilirubin Total: 0.4 mg/dL (ref 0.0–1.2)
Bilirubin, Direct: 0.17 mg/dL (ref 0.00–0.40)
Total Protein: 6.8 g/dL (ref 6.0–8.5)

## 2024-04-06 LAB — LIPID PANEL
Chol/HDL Ratio: 2.2 ratio (ref 0.0–4.4)
Cholesterol, Total: 163 mg/dL (ref 100–199)
HDL: 73 mg/dL (ref >39)
LDL Chol Calc (NIH): 78 mg/dL (ref 0–99)
Triglycerides: 61 mg/dL (ref 0–149)
VLDL Cholesterol Cal: 12 mg/dL (ref 5–40)

## 2024-04-10 DIAGNOSIS — M25642 Stiffness of left hand, not elsewhere classified: Secondary | ICD-10-CM | POA: Diagnosis not present

## 2024-04-10 DIAGNOSIS — M25632 Stiffness of left wrist, not elsewhere classified: Secondary | ICD-10-CM | POA: Diagnosis not present

## 2024-04-11 DIAGNOSIS — M25642 Stiffness of left hand, not elsewhere classified: Secondary | ICD-10-CM | POA: Diagnosis not present

## 2024-04-11 DIAGNOSIS — M25632 Stiffness of left wrist, not elsewhere classified: Secondary | ICD-10-CM | POA: Diagnosis not present

## 2024-04-18 DIAGNOSIS — M25632 Stiffness of left wrist, not elsewhere classified: Secondary | ICD-10-CM | POA: Diagnosis not present

## 2024-04-18 DIAGNOSIS — M25642 Stiffness of left hand, not elsewhere classified: Secondary | ICD-10-CM | POA: Diagnosis not present

## 2024-04-20 DIAGNOSIS — R300 Dysuria: Secondary | ICD-10-CM | POA: Diagnosis not present

## 2024-04-20 DIAGNOSIS — Z1231 Encounter for screening mammogram for malignant neoplasm of breast: Secondary | ICD-10-CM | POA: Diagnosis not present

## 2024-04-20 DIAGNOSIS — M25632 Stiffness of left wrist, not elsewhere classified: Secondary | ICD-10-CM | POA: Diagnosis not present

## 2024-04-20 DIAGNOSIS — Z01411 Encounter for gynecological examination (general) (routine) with abnormal findings: Secondary | ICD-10-CM | POA: Diagnosis not present

## 2024-04-20 DIAGNOSIS — N9089 Other specified noninflammatory disorders of vulva and perineum: Secondary | ICD-10-CM | POA: Diagnosis not present

## 2024-04-20 DIAGNOSIS — M25642 Stiffness of left hand, not elsewhere classified: Secondary | ICD-10-CM | POA: Diagnosis not present

## 2024-04-20 DIAGNOSIS — Z780 Asymptomatic menopausal state: Secondary | ICD-10-CM | POA: Diagnosis not present

## 2024-04-20 DIAGNOSIS — R3129 Other microscopic hematuria: Secondary | ICD-10-CM | POA: Diagnosis not present

## 2024-04-20 DIAGNOSIS — E2839 Other primary ovarian failure: Secondary | ICD-10-CM | POA: Diagnosis not present

## 2024-04-21 ENCOUNTER — Other Ambulatory Visit (HOSPITAL_BASED_OUTPATIENT_CLINIC_OR_DEPARTMENT_OTHER): Payer: Self-pay | Admitting: Obstetrics and Gynecology

## 2024-04-21 DIAGNOSIS — E2839 Other primary ovarian failure: Secondary | ICD-10-CM

## 2024-04-24 DIAGNOSIS — M25642 Stiffness of left hand, not elsewhere classified: Secondary | ICD-10-CM | POA: Diagnosis not present

## 2024-04-24 DIAGNOSIS — M25632 Stiffness of left wrist, not elsewhere classified: Secondary | ICD-10-CM | POA: Diagnosis not present

## 2024-04-24 DIAGNOSIS — S52502D Unspecified fracture of the lower end of left radius, subsequent encounter for closed fracture with routine healing: Secondary | ICD-10-CM | POA: Diagnosis not present

## 2024-05-02 DIAGNOSIS — M25632 Stiffness of left wrist, not elsewhere classified: Secondary | ICD-10-CM | POA: Diagnosis not present

## 2024-05-02 DIAGNOSIS — M25642 Stiffness of left hand, not elsewhere classified: Secondary | ICD-10-CM | POA: Diagnosis not present

## 2024-05-04 DIAGNOSIS — M25642 Stiffness of left hand, not elsewhere classified: Secondary | ICD-10-CM | POA: Diagnosis not present

## 2024-05-04 DIAGNOSIS — M25632 Stiffness of left wrist, not elsewhere classified: Secondary | ICD-10-CM | POA: Diagnosis not present

## 2024-05-09 DIAGNOSIS — M25632 Stiffness of left wrist, not elsewhere classified: Secondary | ICD-10-CM | POA: Diagnosis not present

## 2024-05-09 DIAGNOSIS — M25642 Stiffness of left hand, not elsewhere classified: Secondary | ICD-10-CM | POA: Diagnosis not present

## 2024-05-11 DIAGNOSIS — M25632 Stiffness of left wrist, not elsewhere classified: Secondary | ICD-10-CM | POA: Diagnosis not present

## 2024-05-11 DIAGNOSIS — M25642 Stiffness of left hand, not elsewhere classified: Secondary | ICD-10-CM | POA: Diagnosis not present

## 2024-05-16 DIAGNOSIS — M25642 Stiffness of left hand, not elsewhere classified: Secondary | ICD-10-CM | POA: Diagnosis not present

## 2024-05-16 DIAGNOSIS — M25632 Stiffness of left wrist, not elsewhere classified: Secondary | ICD-10-CM | POA: Diagnosis not present

## 2024-05-18 DIAGNOSIS — M25632 Stiffness of left wrist, not elsewhere classified: Secondary | ICD-10-CM | POA: Diagnosis not present

## 2024-05-18 DIAGNOSIS — M25642 Stiffness of left hand, not elsewhere classified: Secondary | ICD-10-CM | POA: Diagnosis not present

## 2024-05-22 DIAGNOSIS — E785 Hyperlipidemia, unspecified: Secondary | ICD-10-CM | POA: Diagnosis not present

## 2024-05-22 DIAGNOSIS — I10 Essential (primary) hypertension: Secondary | ICD-10-CM | POA: Diagnosis not present

## 2024-05-22 DIAGNOSIS — E039 Hypothyroidism, unspecified: Secondary | ICD-10-CM | POA: Diagnosis not present

## 2024-05-22 DIAGNOSIS — Z1212 Encounter for screening for malignant neoplasm of rectum: Secondary | ICD-10-CM | POA: Diagnosis not present

## 2024-05-23 DIAGNOSIS — M25632 Stiffness of left wrist, not elsewhere classified: Secondary | ICD-10-CM | POA: Diagnosis not present

## 2024-05-23 DIAGNOSIS — M25642 Stiffness of left hand, not elsewhere classified: Secondary | ICD-10-CM | POA: Diagnosis not present

## 2024-05-25 DIAGNOSIS — M25642 Stiffness of left hand, not elsewhere classified: Secondary | ICD-10-CM | POA: Diagnosis not present

## 2024-05-25 DIAGNOSIS — M25632 Stiffness of left wrist, not elsewhere classified: Secondary | ICD-10-CM | POA: Diagnosis not present

## 2024-05-28 ENCOUNTER — Other Ambulatory Visit (HOSPITAL_BASED_OUTPATIENT_CLINIC_OR_DEPARTMENT_OTHER): Payer: Self-pay | Admitting: Obstetrics and Gynecology

## 2024-05-28 DIAGNOSIS — E2839 Other primary ovarian failure: Secondary | ICD-10-CM

## 2024-05-30 DIAGNOSIS — S52502D Unspecified fracture of the lower end of left radius, subsequent encounter for closed fracture with routine healing: Secondary | ICD-10-CM | POA: Diagnosis not present

## 2024-05-30 DIAGNOSIS — M25632 Stiffness of left wrist, not elsewhere classified: Secondary | ICD-10-CM | POA: Diagnosis not present

## 2024-05-30 DIAGNOSIS — M25642 Stiffness of left hand, not elsewhere classified: Secondary | ICD-10-CM | POA: Diagnosis not present

## 2024-06-01 DIAGNOSIS — M25632 Stiffness of left wrist, not elsewhere classified: Secondary | ICD-10-CM | POA: Diagnosis not present

## 2024-06-01 DIAGNOSIS — M25642 Stiffness of left hand, not elsewhere classified: Secondary | ICD-10-CM | POA: Diagnosis not present

## 2024-06-05 DIAGNOSIS — M25632 Stiffness of left wrist, not elsewhere classified: Secondary | ICD-10-CM | POA: Diagnosis not present

## 2024-06-05 DIAGNOSIS — S62102A Fracture of unspecified carpal bone, left wrist, initial encounter for closed fracture: Secondary | ICD-10-CM | POA: Diagnosis not present

## 2024-06-05 DIAGNOSIS — R519 Headache, unspecified: Secondary | ICD-10-CM | POA: Diagnosis not present

## 2024-06-05 DIAGNOSIS — Z1212 Encounter for screening for malignant neoplasm of rectum: Secondary | ICD-10-CM | POA: Diagnosis not present

## 2024-06-05 DIAGNOSIS — R82998 Other abnormal findings in urine: Secondary | ICD-10-CM | POA: Diagnosis not present

## 2024-06-05 DIAGNOSIS — M25642 Stiffness of left hand, not elsewhere classified: Secondary | ICD-10-CM | POA: Diagnosis not present

## 2024-06-05 DIAGNOSIS — D329 Benign neoplasm of meninges, unspecified: Secondary | ICD-10-CM | POA: Diagnosis not present

## 2024-06-05 DIAGNOSIS — I10 Essential (primary) hypertension: Secondary | ICD-10-CM | POA: Diagnosis not present

## 2024-06-05 DIAGNOSIS — E785 Hyperlipidemia, unspecified: Secondary | ICD-10-CM | POA: Diagnosis not present

## 2024-06-05 DIAGNOSIS — E039 Hypothyroidism, unspecified: Secondary | ICD-10-CM | POA: Diagnosis not present

## 2024-06-05 DIAGNOSIS — Z0000 Encounter for general adult medical examination without abnormal findings: Secondary | ICD-10-CM | POA: Diagnosis not present

## 2024-06-05 DIAGNOSIS — Z1331 Encounter for screening for depression: Secondary | ICD-10-CM | POA: Diagnosis not present

## 2024-06-05 DIAGNOSIS — Z1339 Encounter for screening examination for other mental health and behavioral disorders: Secondary | ICD-10-CM | POA: Diagnosis not present

## 2024-06-08 ENCOUNTER — Other Ambulatory Visit: Payer: Self-pay | Admitting: Internal Medicine

## 2024-06-08 DIAGNOSIS — M25632 Stiffness of left wrist, not elsewhere classified: Secondary | ICD-10-CM | POA: Diagnosis not present

## 2024-06-08 DIAGNOSIS — D329 Benign neoplasm of meninges, unspecified: Secondary | ICD-10-CM

## 2024-06-08 DIAGNOSIS — M25642 Stiffness of left hand, not elsewhere classified: Secondary | ICD-10-CM | POA: Diagnosis not present

## 2024-06-19 DIAGNOSIS — M25632 Stiffness of left wrist, not elsewhere classified: Secondary | ICD-10-CM | POA: Diagnosis not present

## 2024-06-19 DIAGNOSIS — M25642 Stiffness of left hand, not elsewhere classified: Secondary | ICD-10-CM | POA: Diagnosis not present

## 2024-06-20 ENCOUNTER — Ambulatory Visit
Admission: RE | Admit: 2024-06-20 | Discharge: 2024-06-20 | Disposition: A | Discharge status: Home / Self Care | Source: Ambulatory Visit | Attending: Internal Medicine | Admitting: Internal Medicine

## 2024-06-20 DIAGNOSIS — R42 Dizziness and giddiness: Secondary | ICD-10-CM | POA: Diagnosis not present

## 2024-06-20 DIAGNOSIS — R519 Headache, unspecified: Secondary | ICD-10-CM | POA: Diagnosis not present

## 2024-06-20 DIAGNOSIS — D329 Benign neoplasm of meninges, unspecified: Secondary | ICD-10-CM

## 2024-06-20 MED ORDER — GADOPICLENOL 0.5 MMOL/ML IV SOLN
7.5000 mL | Freq: Once | INTRAVENOUS | Status: AC | PRN
  Administered 2024-06-20: 7.5 mL via INTRAVENOUS

## 2024-06-22 ENCOUNTER — Other Ambulatory Visit: Payer: Self-pay

## 2024-06-26 DIAGNOSIS — M25642 Stiffness of left hand, not elsewhere classified: Secondary | ICD-10-CM | POA: Diagnosis not present

## 2024-06-26 DIAGNOSIS — M25632 Stiffness of left wrist, not elsewhere classified: Secondary | ICD-10-CM | POA: Diagnosis not present

## 2024-06-28 DIAGNOSIS — M25642 Stiffness of left hand, not elsewhere classified: Secondary | ICD-10-CM | POA: Diagnosis not present

## 2024-06-28 DIAGNOSIS — M25632 Stiffness of left wrist, not elsewhere classified: Secondary | ICD-10-CM | POA: Diagnosis not present

## 2024-06-30 DIAGNOSIS — S52502D Unspecified fracture of the lower end of left radius, subsequent encounter for closed fracture with routine healing: Secondary | ICD-10-CM | POA: Diagnosis not present

## 2024-07-03 DIAGNOSIS — M25632 Stiffness of left wrist, not elsewhere classified: Secondary | ICD-10-CM | POA: Diagnosis not present

## 2024-07-03 DIAGNOSIS — M25642 Stiffness of left hand, not elsewhere classified: Secondary | ICD-10-CM | POA: Diagnosis not present

## 2024-07-05 DIAGNOSIS — M25642 Stiffness of left hand, not elsewhere classified: Secondary | ICD-10-CM | POA: Diagnosis not present

## 2024-07-05 DIAGNOSIS — M25632 Stiffness of left wrist, not elsewhere classified: Secondary | ICD-10-CM | POA: Diagnosis not present

## 2024-07-10 DIAGNOSIS — M25632 Stiffness of left wrist, not elsewhere classified: Secondary | ICD-10-CM | POA: Diagnosis not present

## 2024-07-10 DIAGNOSIS — M25642 Stiffness of left hand, not elsewhere classified: Secondary | ICD-10-CM | POA: Diagnosis not present

## 2024-07-12 DIAGNOSIS — M25632 Stiffness of left wrist, not elsewhere classified: Secondary | ICD-10-CM | POA: Diagnosis not present

## 2024-07-12 DIAGNOSIS — M25642 Stiffness of left hand, not elsewhere classified: Secondary | ICD-10-CM | POA: Diagnosis not present

## 2024-07-15 DIAGNOSIS — Z23 Encounter for immunization: Secondary | ICD-10-CM | POA: Diagnosis not present

## 2024-07-18 DIAGNOSIS — M25642 Stiffness of left hand, not elsewhere classified: Secondary | ICD-10-CM | POA: Diagnosis not present

## 2024-07-18 DIAGNOSIS — M25632 Stiffness of left wrist, not elsewhere classified: Secondary | ICD-10-CM | POA: Diagnosis not present

## 2024-07-20 DIAGNOSIS — M25642 Stiffness of left hand, not elsewhere classified: Secondary | ICD-10-CM | POA: Diagnosis not present

## 2024-07-20 DIAGNOSIS — M25632 Stiffness of left wrist, not elsewhere classified: Secondary | ICD-10-CM | POA: Diagnosis not present

## 2024-07-24 DIAGNOSIS — M25632 Stiffness of left wrist, not elsewhere classified: Secondary | ICD-10-CM | POA: Diagnosis not present

## 2024-07-24 DIAGNOSIS — M25642 Stiffness of left hand, not elsewhere classified: Secondary | ICD-10-CM | POA: Diagnosis not present

## 2024-07-27 DIAGNOSIS — M25632 Stiffness of left wrist, not elsewhere classified: Secondary | ICD-10-CM | POA: Diagnosis not present

## 2024-07-27 DIAGNOSIS — M25642 Stiffness of left hand, not elsewhere classified: Secondary | ICD-10-CM | POA: Diagnosis not present

## 2024-07-31 DIAGNOSIS — M25632 Stiffness of left wrist, not elsewhere classified: Secondary | ICD-10-CM | POA: Diagnosis not present

## 2024-07-31 DIAGNOSIS — M25642 Stiffness of left hand, not elsewhere classified: Secondary | ICD-10-CM | POA: Diagnosis not present

## 2024-08-02 DIAGNOSIS — M25642 Stiffness of left hand, not elsewhere classified: Secondary | ICD-10-CM | POA: Diagnosis not present

## 2024-08-02 DIAGNOSIS — M25632 Stiffness of left wrist, not elsewhere classified: Secondary | ICD-10-CM | POA: Diagnosis not present

## 2024-08-07 DIAGNOSIS — M25642 Stiffness of left hand, not elsewhere classified: Secondary | ICD-10-CM | POA: Diagnosis not present

## 2024-08-07 DIAGNOSIS — M25632 Stiffness of left wrist, not elsewhere classified: Secondary | ICD-10-CM | POA: Diagnosis not present

## 2024-08-08 DIAGNOSIS — S52572D Other intraarticular fracture of lower end of left radius, subsequent encounter for closed fracture with routine healing: Secondary | ICD-10-CM | POA: Diagnosis not present

## 2024-08-09 DIAGNOSIS — M25632 Stiffness of left wrist, not elsewhere classified: Secondary | ICD-10-CM | POA: Diagnosis not present

## 2024-08-09 DIAGNOSIS — M25642 Stiffness of left hand, not elsewhere classified: Secondary | ICD-10-CM | POA: Diagnosis not present

## 2024-08-14 ENCOUNTER — Encounter: Payer: Self-pay | Admitting: Radiology

## 2024-08-14 DIAGNOSIS — M25642 Stiffness of left hand, not elsewhere classified: Secondary | ICD-10-CM | POA: Diagnosis not present

## 2024-08-14 DIAGNOSIS — M25632 Stiffness of left wrist, not elsewhere classified: Secondary | ICD-10-CM | POA: Diagnosis not present

## 2024-08-17 DIAGNOSIS — M25642 Stiffness of left hand, not elsewhere classified: Secondary | ICD-10-CM | POA: Diagnosis not present

## 2024-08-17 DIAGNOSIS — M25632 Stiffness of left wrist, not elsewhere classified: Secondary | ICD-10-CM | POA: Diagnosis not present

## 2024-08-21 DIAGNOSIS — M25642 Stiffness of left hand, not elsewhere classified: Secondary | ICD-10-CM | POA: Diagnosis not present

## 2024-08-21 DIAGNOSIS — M25632 Stiffness of left wrist, not elsewhere classified: Secondary | ICD-10-CM | POA: Diagnosis not present

## 2024-08-29 DIAGNOSIS — M25642 Stiffness of left hand, not elsewhere classified: Secondary | ICD-10-CM | POA: Diagnosis not present

## 2024-08-29 DIAGNOSIS — M25632 Stiffness of left wrist, not elsewhere classified: Secondary | ICD-10-CM | POA: Diagnosis not present

## 2024-08-31 DIAGNOSIS — M25642 Stiffness of left hand, not elsewhere classified: Secondary | ICD-10-CM | POA: Diagnosis not present

## 2024-08-31 DIAGNOSIS — M25632 Stiffness of left wrist, not elsewhere classified: Secondary | ICD-10-CM | POA: Diagnosis not present

## 2024-09-04 DIAGNOSIS — M25642 Stiffness of left hand, not elsewhere classified: Secondary | ICD-10-CM | POA: Diagnosis not present

## 2024-09-04 DIAGNOSIS — M25632 Stiffness of left wrist, not elsewhere classified: Secondary | ICD-10-CM | POA: Diagnosis not present

## 2024-09-11 DIAGNOSIS — M25632 Stiffness of left wrist, not elsewhere classified: Secondary | ICD-10-CM | POA: Diagnosis not present

## 2024-09-11 DIAGNOSIS — M25642 Stiffness of left hand, not elsewhere classified: Secondary | ICD-10-CM | POA: Diagnosis not present

## 2024-09-12 ENCOUNTER — Ambulatory Visit (INDEPENDENT_AMBULATORY_CARE_PROVIDER_SITE_OTHER)

## 2024-09-12 VITALS — BP 164/94 | HR 66 | Temp 97.7°F | Resp 18 | Ht 61.0 in | Wt 164.6 lb

## 2024-09-12 DIAGNOSIS — E7849 Other hyperlipidemia: Secondary | ICD-10-CM | POA: Diagnosis not present

## 2024-09-12 MED ORDER — INCLISIRAN SODIUM 284 MG/1.5ML ~~LOC~~ SOSY
284.0000 mg | PREFILLED_SYRINGE | Freq: Once | SUBCUTANEOUS | Status: AC
  Administered 2024-09-12: 284 mg via SUBCUTANEOUS
  Filled 2024-09-12: qty 1.5

## 2024-09-12 NOTE — Progress Notes (Signed)
 Diagnosis: Hyperlipidemia  Provider:  Mannam, Praveen MD  Procedure: Injection  Leqvio  (inclisiran), Dose: 284 mg, Site: subcutaneous, Number of injections: 1  Injection Site(s): Left lower quad. abdomen  Post Care: Patient declined observation  Discharge: Condition: Good, Destination: Home . AVS Provided  Performed by:  Kalani Sthilaire, RN

## 2024-09-19 DIAGNOSIS — M25642 Stiffness of left hand, not elsewhere classified: Secondary | ICD-10-CM | POA: Diagnosis not present

## 2024-09-19 DIAGNOSIS — M25632 Stiffness of left wrist, not elsewhere classified: Secondary | ICD-10-CM | POA: Diagnosis not present

## 2024-10-11 ENCOUNTER — Other Ambulatory Visit: Payer: Self-pay | Admitting: Cardiology

## 2024-10-11 DIAGNOSIS — E7849 Other hyperlipidemia: Secondary | ICD-10-CM

## 2024-10-11 DIAGNOSIS — E7800 Pure hypercholesterolemia, unspecified: Secondary | ICD-10-CM

## 2025-03-13 ENCOUNTER — Ambulatory Visit
